# Patient Record
Sex: Female | Born: 1985
Health system: Southern US, Community
[De-identification: ages and names within clinical notes are randomized; demographics above are authoritative.]

## PROBLEM LIST (undated history)

## (undated) ENCOUNTER — Emergency Department (HOSPITAL_COMMUNITY): Admission: EM | Discharge status: Absent without leave | Payer: BC Managed Care – PPO

## (undated) DIAGNOSIS — Z8619 Personal history of other infectious and parasitic diseases: Secondary | ICD-10-CM

## (undated) DIAGNOSIS — Z87442 Personal history of urinary calculi: Secondary | ICD-10-CM

## (undated) DIAGNOSIS — Z87448 Personal history of other diseases of urinary system: Secondary | ICD-10-CM

## (undated) DIAGNOSIS — J45909 Unspecified asthma, uncomplicated: Secondary | ICD-10-CM

## (undated) DIAGNOSIS — D649 Anemia, unspecified: Secondary | ICD-10-CM

## (undated) DIAGNOSIS — N189 Chronic kidney disease, unspecified: Secondary | ICD-10-CM

## (undated) HISTORY — DX: Personal history of other diseases of urinary system: Z87.448

## (undated) HISTORY — PX: NO PAST SURGERIES: SHX2092

## (undated) HISTORY — DX: Personal history of other infectious and parasitic diseases: Z86.19

## (undated) HISTORY — DX: Chronic kidney disease, unspecified: N18.9

## (undated) HISTORY — DX: Unspecified asthma, uncomplicated: J45.909

## (undated) HISTORY — DX: Personal history of urinary calculi: Z87.442

---
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
## 2007-05-10 ENCOUNTER — Emergency Department (HOSPITAL_COMMUNITY): Admission: EM | Admit: 2007-05-10 | Discharge: 2007-05-10 | Payer: Self-pay | Admitting: Emergency Medicine

## 2008-03-24 ENCOUNTER — Ambulatory Visit (HOSPITAL_COMMUNITY): Admission: RE | Admit: 2008-03-24 | Discharge: 2008-03-24 | Payer: Self-pay | Admitting: Family Medicine

## 2009-01-24 ENCOUNTER — Ambulatory Visit (HOSPITAL_COMMUNITY): Admission: RE | Admit: 2009-01-24 | Discharge: 2009-01-24 | Payer: Self-pay | Admitting: Urology

## 2010-07-12 ENCOUNTER — Other Ambulatory Visit: Admission: RE | Admit: 2010-07-12 | Discharge: 2010-07-12 | Payer: Self-pay | Admitting: Obstetrics and Gynecology

## 2011-02-07 ENCOUNTER — Other Ambulatory Visit (HOSPITAL_COMMUNITY): Payer: Self-pay | Admitting: Obstetrics and Gynecology

## 2011-02-07 DIAGNOSIS — N971 Female infertility of tubal origin: Secondary | ICD-10-CM

## 2011-02-14 ENCOUNTER — Ambulatory Visit (HOSPITAL_COMMUNITY)
Admission: RE | Admit: 2011-02-14 | Discharge: 2011-02-14 | Disposition: A | Discharge status: Home / Self Care | Payer: 59 | Source: Ambulatory Visit | Attending: Obstetrics and Gynecology | Admitting: Obstetrics and Gynecology

## 2011-02-14 DIAGNOSIS — N979 Female infertility, unspecified: Secondary | ICD-10-CM | POA: Insufficient documentation

## 2011-02-14 DIAGNOSIS — N971 Female infertility of tubal origin: Secondary | ICD-10-CM

## 2011-07-28 ENCOUNTER — Encounter: Payer: Self-pay | Admitting: *Deleted

## 2011-07-28 ENCOUNTER — Emergency Department (HOSPITAL_COMMUNITY)
Admission: EM | Admit: 2011-07-28 | Discharge: 2011-07-28 | Disposition: A | Discharge status: Home / Self Care | Payer: 59 | Attending: Emergency Medicine | Admitting: Emergency Medicine

## 2011-07-28 ENCOUNTER — Emergency Department (HOSPITAL_COMMUNITY): Payer: 59

## 2011-07-28 DIAGNOSIS — F17200 Nicotine dependence, unspecified, uncomplicated: Secondary | ICD-10-CM | POA: Insufficient documentation

## 2011-07-28 DIAGNOSIS — N201 Calculus of ureter: Secondary | ICD-10-CM

## 2011-07-28 LAB — URINALYSIS, ROUTINE W REFLEX MICROSCOPIC
Glucose, UA: NEGATIVE mg/dL
Ketones, ur: >80 mg/dL — AB
Leukocytes, UA: NEGATIVE
Nitrite: NEGATIVE
Specific Gravity, Urine: >1.03 — ABNORMAL HIGH (ref 1.005–1.030)
pH: 6 (ref 5.0–8.0)

## 2011-07-28 LAB — DIFFERENTIAL
Basophils Absolute: 0.1 10*3/uL (ref 0.0–0.1)
Basophils Relative: 0 % (ref 0–1)
Lymphocytes Relative: 18 % (ref 12–46)
Monocytes Relative: 6 % (ref 3–12)
Neutro Abs: 14.3 10*3/uL — ABNORMAL HIGH (ref 1.7–7.7)
Neutrophils Relative %: 74 % (ref 43–77)

## 2011-07-28 LAB — CBC
Hemoglobin: 13.4 g/dL (ref 12.0–15.0)
MCHC: 33.8 g/dL (ref 30.0–36.0)
RDW: 13 % (ref 11.5–15.5)
WBC: 19.4 10*3/uL — ABNORMAL HIGH (ref 4.0–10.5)

## 2011-07-28 LAB — POCT PREGNANCY, URINE: Preg Test, Ur: NEGATIVE

## 2011-07-28 LAB — URINE MICROSCOPIC-ADD ON

## 2011-07-28 LAB — BASIC METABOLIC PANEL
CO2: 21 mEq/L (ref 19–32)
Chloride: 99 mEq/L (ref 96–112)
GFR calc Af Amer: >60 mL/min (ref >60)
Potassium: 3.1 mEq/L — ABNORMAL LOW (ref 3.5–5.1)

## 2011-07-28 MED ORDER — IBUPROFEN 800 MG PO TABS: 800.0000 mg | ORAL_TABLET | Freq: Three times a day (TID) | ORAL | Status: AC

## 2011-07-28 MED ORDER — HYDROMORPHONE HCL 1 MG/ML IJ SOLN
INTRAMUSCULAR | Status: AC
  Filled 2011-07-28: qty 1

## 2011-07-28 MED ORDER — HYDROMORPHONE HCL 1 MG/ML IJ SOLN
1.0000 mg | Freq: Once | INTRAMUSCULAR | Status: AC
  Administered 2011-07-28: 1 mg via INTRAVENOUS
  Filled 2011-07-28: qty 1

## 2011-07-28 MED ORDER — HYDROMORPHONE HCL 1 MG/ML IJ SOLN
1.0000 mg | Freq: Once | INTRAMUSCULAR | Status: AC
  Administered 2011-07-28: 1 mg via INTRAVENOUS

## 2011-07-28 MED ORDER — ONDANSETRON HCL 4 MG/2ML IJ SOLN
4.0000 mg | Freq: Once | INTRAMUSCULAR | Status: AC
  Administered 2011-07-28: 4 mg via INTRAVENOUS
  Filled 2011-07-28: qty 2

## 2011-07-28 MED ORDER — OXYCODONE-ACETAMINOPHEN 5-325 MG PO TABS: 1.0000 | ORAL_TABLET | Freq: Four times a day (QID) | ORAL | Status: AC | PRN

## 2011-07-28 NOTE — ED Notes (Signed)
After completing vitals pt stated she had become sleepy since receiving medication.  Turned off lights and made sure call light was within reach.

## 2011-07-28 NOTE — ED Notes (Signed)
Patient given medication for pain. See MAR. Patient appears in less distress at this time. Will continue to monitor.

## 2011-07-28 NOTE — ED Notes (Signed)
Patient resting with eyes closed at this time. Rates pain at 4/10 on NPS. NAD noted at this time.

## 2011-07-28 NOTE — ED Provider Notes (Signed)
Scribed for [employer]. Bernette Mayers, MD, the patient was seen in room APA02/APA02. This chart was scribed by [employer]. The patient's care started at 13:47  CSN: [id number] Arrival date & time: 07/28/2011  1:20 PM  Chief Complaint  Patient presents with  . Abdominal Pain  . Flank Pain   HPI Alyssa Mason is a 25 y.o. female who presents to the Emergency Department complaining of sharp, constant RLQ abdominal pain radiating from the back with sudden onset 2-3 hours ago. She reports that pain sometimes radiates into her groin and is not alleviated by laying still. She denies difficulty urinating, fever, diarrhea, constipation or surgeries on her abdomen. She reports nausea. Patient's LNMP was 2 weeks ago. She is on Clomiphene. There are no other associated symptoms and no other alleviating or aggravating factors.    HPI ELEMENTS:  Location: RLQ abdomen  Onset: 11am 07/28/2011 Duration: 2-3 hours ago  Timing: constant Quality: sharp  Context:  as above  Associated symptoms: as above    History reviewed. No pertinent past medical history. MEDICATIONS:  Previous Medications   CLOMIPHENE (CLOMID) 50 MG TABLET    Take 50 mg by mouth daily.       ALLERGIES:  Allergies as of 07/28/2011  . (No Known Allergies)      History reviewed. No pertinent past surgical history.  No family history on file.  History  Substance Use Topics  . Smoking status: Current Everyday Smoker -- 0.5 packs/day    Types: Cigarettes  . Smokeless tobacco: Not on file  . Alcohol Use: No    OB History    Grav Para Term Preterm Abortions TAB SAB Ect Mult Living                  Review of Systems  Constitutional: Negative for fever.  Gastrointestinal: Positive for nausea. Negative for diarrhea and constipation.  Genitourinary: Positive for flank pain. Negative for dysuria, urgency and difficulty urinating.  All other systems reviewed and are negative.    Physical Exam  BP 105/87  Pulse 84   Temp(Src) 97.9 F (36.6 C) (Oral)  Resp 22  Ht 5\' 1"  (1.549 m)  Wt 145 lb (65.772 kg)  BMI 27.40 kg/m2  SpO2 100%  LMP 07/14/2011  Physical Exam  Nursing note and vitals reviewed. Constitutional: She is oriented to person, place, and time. She appears well-developed and well-nourished. She appears distressed.  HENT:  Head: Normocephalic and atraumatic.  Mouth/Throat: No oropharyngeal exudate.  Eyes: Conjunctivae are normal. Pupils are equal, round, and reactive to light.  Neck: Normal range of motion. Neck supple. No tracheal deviation present.  Cardiovascular: Normal rate, regular rhythm, normal heart sounds and intact distal pulses.   No murmur heard. Pulmonary/Chest: Effort normal and breath sounds normal. No respiratory distress. She has no wheezes. She has no rales.  Abdominal: Soft. Bowel sounds are normal. She exhibits no distension. There is tenderness (Right lower quadrant). There is no rebound, no guarding and no CVA tenderness.  Musculoskeletal: Normal range of motion. She exhibits no edema and no tenderness.  Neurological: She is alert and oriented to person, place, and time. No cranial nerve deficit.  Skin: Skin is warm and dry. No rash noted. She is not diaphoretic. No erythema.  Psychiatric: She has a normal mood and affect.    ED Course  Procedures  OTHER DATA REVIEWED: Nursing notes, vital signs, and past medical records reviewed.    DIAGNOSTIC STUDIES: Oxygen Saturation is 100% on room air,  normal by my interpretation.      LABS / RADIOLOGY:  Results for orders placed during the hospital encounter of 07/28/11  URINALYSIS, ROUTINE W REFLEX MICROSCOPIC      Component Value Range   Color, Urine YELLOW  YELLOW    Appearance HAZY (*) CLEAR    Specific Gravity, Urine >1.030 (*) 1.005 - 1.030    pH 6.0  5.0 - 8.0    Glucose, UA NEGATIVE  NEGATIVE (mg/dL)   Hgb urine dipstick SMALL (*) NEGATIVE    Bilirubin Urine NEGATIVE  NEGATIVE    Ketones, ur >80 (*)  NEGATIVE (mg/dL)   Protein, ur NEGATIVE  NEGATIVE (mg/dL)   Urobilinogen, UA 0.2  0.0 - 1.0 (mg/dL)   Nitrite NEGATIVE  NEGATIVE    Leukocytes, UA NEGATIVE  NEGATIVE   CBC      Component Value Range   WBC 19.4 (*) 4.0 - 10.5 (K/uL)   RBC 4.59  3.87 - 5.11 (MIL/uL)   Hemoglobin 13.4  12.0 - 15.0 (g/dL)   HCT 47.8  29.5 - 62.1 (%)   MCV 86.3  78.0 - 100.0 (fL)   MCH 29.2  26.0 - 34.0 (pg)   MCHC 33.8  30.0 - 36.0 (g/dL)   RDW 30.8  65.7 - 84.6 (%)   Platelets 338  150 - 400 (K/uL)  DIFFERENTIAL      Component Value Range   Neutrophils Relative 74  43 - 77 (%)   Neutro Abs 14.3 (*) 1.7 - 7.7 (K/uL)   Lymphocytes Relative 18  12 - 46 (%)   Lymphs Abs 3.6  0.7 - 4.0 (K/uL)   Monocytes Relative 6  3 - 12 (%)   Monocytes Absolute 1.2 (*) 0.1 - 1.0 (K/uL)   Eosinophils Relative 2  0 - 5 (%)   Eosinophils Absolute 0.3  0.0 - 0.7 (K/uL)   Basophils Relative 0  0 - 1 (%)   Basophils Absolute 0.1  0.0 - 0.1 (K/uL)  BASIC METABOLIC PANEL      Component Value Range   Sodium 136  135 - 145 (mEq/L)   Potassium 3.1 (*) 3.5 - 5.1 (mEq/L)   Chloride 99  96 - 112 (mEq/L)   CO2 21  19 - 32 (mEq/L)   Glucose, Bld 120 (*) 70 - 99 (mg/dL)   BUN 9  6 - 23 (mg/dL)   Creatinine, Ser 9.62  0.50 - 1.10 (mg/dL)   Calcium 9.7  8.4 - 95.2 (mg/dL)   GFR calc non Af Amer >60  >60 (mL/min)   GFR calc Af Amer >60  >60 (mL/min)  POCT PREGNANCY, URINE      Component Value Range   Preg Test, Ur NEGATIVE    URINE MICROSCOPIC-ADD ON      Component Value Range   Squamous Epithelial / LPF FEW (*) RARE    WBC, UA 3-6  <3 (WBC/hpf)   RBC / HPF 3-6  <3 (RBC/hpf)   Bacteria, UA FEW (*) RARE      ED COURSE / COORDINATION OF CARE: 13:50 - EDMD examined patient and ordered a CT Abdomen Pelvis Wo Contrast, UA, CBC, Differential, BMP and Pregnancy, urine POC. 14:28 - Patient resting with eyes closed. Rates pain at 4/10 on NPS  MDM: CT shows a UVJ stone by my interpretation. Awaiting official radiology read.  Anticipate discharge if s/he agrees. Pt and family aware of plan. Pain controlled. Resting comfortably.  I personally performed the services described in the documentation, which were  scribed in my presence. The recorded information has been reviewed and considered.   SHELDON,CHARLES B.    Charles B. Bernette Mayers, MD 07/28/11 [phone]

## 2011-07-28 NOTE — ED Notes (Signed)
Patient with no complaints at this time. Respirations even and unlabored. Skin warm/dry. Discharge instructions reviewed with patient at this time. Patient given opportunity to voice concerns/ask questions. IV removed per policy and band-aid applied to site. Patient discharged at this time and left Emergency Department with steady gait.  

## 2011-07-28 NOTE — ED Notes (Signed)
Patient lying in bed with eyes closed. NAD noted. Appears to be sleeping. Family denies any needs at this time. Will continue to monitor.  Patient with 0/10 pain per FLACC scale.

## 2011-07-28 NOTE — ED Notes (Signed)
Patient ambulatory to restroom with steady gait.  Patient reporting pain. Dr. Bernette Mayers aware.  Dr. Bernette Mayers currently at bedside.

## 2011-07-28 NOTE — ED Notes (Signed)
Reports sudden onset of right flank pain approx 1-2 hours ago; reports pain in flank has subsided, but now has RLQ pain; describes as sharp; pt is pale, restless and c/o n/v with pain

## 2011-09-04 LAB — OB RESULTS CONSOLE ANTIBODY SCREEN: Antibody Screen: NEGATIVE

## 2011-09-04 LAB — OB RESULTS CONSOLE GC/CHLAMYDIA
Chlamydia: NEGATIVE
Gonorrhea: NEGATIVE

## 2011-09-04 LAB — OB RESULTS CONSOLE HEPATITIS B SURFACE ANTIGEN: Hepatitis B Surface Ag: NEGATIVE

## 2011-09-20 LAB — URINALYSIS, ROUTINE W REFLEX MICROSCOPIC
Bilirubin Urine: NEGATIVE
Glucose, UA: NEGATIVE
Ketones, ur: NEGATIVE
Protein, ur: NEGATIVE

## 2011-09-20 LAB — URINE MICROSCOPIC-ADD ON

## 2011-12-04 NOTE — L&D Delivery Note (Signed)
Delivery Note At 5:59 PM a viable and healthy female was delivered via Kiwi vacuum extraction.  Vertex was +3 station, ROA.  Fetal decels were noted with pushing.   APGAR: 8, 9; weight 7 lb 12.7 oz (3535 g).   Placenta status: Intact, Spontaneous.  Cord: 3 vessels.  Anesthesia: Epidural  Episiotomy: Right Mediolateral Lacerations: None Suture Repair: 3.0 chromic Est. Blood Loss (mL): 300  Mom to postpartum.  Baby to nursery-stable.  Mickel Baas 05/22/2012, 7:43 PM

## 2012-02-19 ENCOUNTER — Encounter (HOSPITAL_COMMUNITY): Payer: Self-pay

## 2012-02-19 ENCOUNTER — Emergency Department (HOSPITAL_COMMUNITY)
Admission: EM | Admit: 2012-02-19 | Discharge: 2012-02-20 | Disposition: A | Discharge status: Home / Self Care | Payer: 59 | Attending: Emergency Medicine | Admitting: Emergency Medicine

## 2012-02-19 DIAGNOSIS — J039 Acute tonsillitis, unspecified: Secondary | ICD-10-CM | POA: Insufficient documentation

## 2012-02-19 LAB — RAPID STREP SCREEN (MED CTR MEBANE ONLY): Streptococcus, Group A Screen (Direct): NEGATIVE

## 2012-02-19 NOTE — ED Provider Notes (Signed)
History     CSN: [id number]  Arrival date & time 02/19/12  2009   First MD Initiated Contact with Patient 02/19/12 2346      Chief Complaint  Patient presents with  . Sore Throat    (Consider location/radiation/quality/duration/timing/severity/associated sxs/prior treatment) HPI  26 year old female who is 7 months pregnant is presents with chief complaints of sore throat. Patient states for the past 5-6 days she has been throat irritation. Patient noticed white discharge from the tonsils. Patient continues an abnormal taste in her mouth. She has subjective fever, and chills. Patient denies sneezing, coughing, ear pain, runny nose, chest pain, shortness of breath, change in voice. She has followup with the primary care doctor initially and was prescribed prednisone. However she has not taken the prednisone due to being pregnant.  Pt is here requesting abx.  She denies abd pain, or rash.    History reviewed. No pertinent past medical history.  History reviewed. No pertinent past surgical history.  History reviewed. No pertinent family history.  History  Substance Use Topics  . Smoking status: Current Everyday Smoker -- 0.5 packs/day    Types: Cigarettes  . Smokeless tobacco: Not on file  . Alcohol Use: No    OB History    Grav Para Term Preterm Abortions TAB SAB Ect Mult Living                  Review of Systems  All other systems reviewed and are negative.    Allergies  Review of patient's allergies indicates no known allergies.  Home Medications   Current Outpatient Rx  Name Route Sig Dispense Refill  . ALBUTEROL 90 MCG/ACT IN AERS Inhalation Inhale 1 puff into the lungs daily.      Marland Kitchen CLOMIPHENE CITRATE 50 MG PO TABS Oral Take 50 mg by mouth daily.     Marland Kitchen CLOMIPHENE CITRATE 50 MG PO TABS Oral Take 200 mg by mouth daily. Patient starts on the third day for her cycle, and takes if for only 5 days.       BP 138/68  Pulse 103  Temp(Src) 98.1 F (36.7 C) (Oral)   Resp 18  SpO2 100%  Physical Exam  Nursing note and vitals reviewed. Constitutional: She appears well-developed and well-nourished. No distress.  HENT:  Head: Normocephalic and atraumatic.  Right Ear: External ear normal.  Nose: Nose normal.  Mouth/Throat: Uvula is midline and mucous membranes are normal. Oropharyngeal exudate present. No tonsillar abscesses.       Bilateral tonsillar exudate. No evidence of peritonsillar abscess, no evidence of Ludwig's angina.    Eyes: Conjunctivae are normal.  Neck: Normal range of motion. Neck supple.  Cardiovascular: Normal rate and regular rhythm.   Pulmonary/Chest: Effort normal and breath sounds normal. No respiratory distress. She exhibits no tenderness.  Abdominal: Soft. There is no tenderness.       Gravid  Lymphadenopathy:    She has cervical adenopathy.  Neurological: She is alert.    ED Course  Procedures (including critical care time)   Labs Reviewed  RAPID STREP SCREEN   No results found.   No diagnosis found.    MDM  Pt with tonsilar exudates and subjective fever.  No changes in voice.  No evidence of PTA.  Pt is concern due to duration of her sxs and sxs worsening.  She qualifies for [facility] criteria eventhough strep neg.  Will prescribe abx and lortab.  However i recommend pt to wait and treat and to f/u  with her PCP for further evaluation.  Since pt has glucose test tomorrow, i recommend not taking prednisone.  Pt voice understanding.          Fayrene Helper, PA-C 02/20/12 0032  Fayrene Helper, PA-C 02/20/12 1610

## 2012-02-19 NOTE — ED Notes (Signed)
Pt complains of a sore throat since last Thursday and she feels like her gums are swelling

## 2012-02-20 ENCOUNTER — Encounter (HOSPITAL_COMMUNITY): Payer: Self-pay | Admitting: *Deleted

## 2012-02-20 MED ORDER — LIDOCAINE VISCOUS 2 % MT SOLN: 20.0000 mL | OROMUCOSAL | Status: AC | PRN

## 2012-02-20 MED ORDER — AMOXICILLIN 500 MG PO CAPS: 500.0000 mg | ORAL_CAPSULE | Freq: Three times a day (TID) | ORAL | Status: AC

## 2012-02-20 NOTE — ED Notes (Signed)
Called to see pt with complaint of sore throat and also stated on arrival to room that she didn't feel her baby move. Arrived to pt on monitor with FHR 140's. G1P0, EDC 6/16 27 3/7weeks with good fetal movement and audible kicking. Pt states she is feeling the baby move a lot now that she is laying down. Discussed how to do kick counts. Pt reassured that baby is moving well. Tracing reviewed with EDP, monitoring d/c's. Pt will continue with routine care with [facility], next appoint today 3/20.

## 2012-02-20 NOTE — Discharge Instructions (Signed)
Tonsillitis Tonsils are lumps of lymphoid tissues at the back of the throat. Each tonsil has 20 crevices (crypts). Tonsils help fight nose and throat infections and keep infection from spreading to other parts of the body for the first 18 months of life. Tonsillitis is an infection of the throat that causes the tonsils to become red, tender, and swollen. CAUSES Sudden and, if treated, temporary (acute) tonsillitis is usually caused by infection with streptococcal bacteria. Long lasting (chronic) tonsillitis occurs when the crypts of the tonsils become filled with pieces of food and bacteria, which makes it easy for the tonsils to become constantly infected. SYMPTOMS  Symptoms of tonsillitis include:  A sore throat.   White patches on the tonsils.   Fever.   Tiredness.  DIAGNOSIS Tonsillitis can be diagnosed through a physical exam. Diagnosis can be confirmed with the results of lab tests, including a throat culture. TREATMENT  The goals of tonsillitis treatment include the reduction of the severity and duration of symptoms, prevention of associated conditions, and prevention of disease transmission. Tonsillitis caused by bacteria can be treated with antibiotics. Usually, treatment with antibiotics is started before the cause of the tonsillitis is known. However, if it is determined that the cause is not bacterial, antibiotics will not treat the tonsillitis. If attacks of tonsillitis are severe and frequent, your caregiver may recommend surgery to remove the tonsils (tonsillectomy). HOME CARE INSTRUCTIONS   Rest as much as possible and get plenty of sleep.   Drink plenty of fluids. While the throat is very sore, eat soft foods or liquids, such as sherbet, soups, or instant breakfast drinks.   Eat frozen ice pops.   Older children and adults may gargle with a warm or cold liquid to help soothe the throat. Mix 1 teaspoon of salt in 1 cup of water.   Other family members who also develop a  sore throat or fever should have a medical exam or throat culture.   Only take over-the-counter or prescription medicines for pain, discomfort, or fever as directed by your caregiver.   If you are given antibiotics, take them as directed. Finish them even if you start to feel better.  SEEK MEDICAL CARE IF:   Your baby is older than 3 months with a rectal temperature of 100.5 F (38.1 C) or higher for more than 1 day.   Large, tender lumps develop in your neck.   A rash develops.   Green, yellow-brown, or bloody substance is coughed up.   You are unable to swallow liquids or food for 24 hours.   Your child is unable to swallow food or liquids for 12 hours.  SEEK IMMEDIATE MEDICAL CARE IF:   You develop any new symptoms such as vomiting, severe headache, stiff neck, chest pain, or trouble breathing or swallowing.   You have severe throat pain along with drooling or voice changes.   You have severe pain, unrelieved with recommended medications.   You are unable to fully open the mouth.   You develop redness, swelling, or severe pain anywhere in the neck.   You have a fever.   Your baby is older than 3 months with a rectal temperature of 102 F (38.9 C) or higher.   Your baby is 12 months old or younger with a rectal temperature of 100.4 F (38 C) or higher.  MAKE SURE YOU:   Understand these instructions.   Will watch your condition.   Will get help right away if you are not  watch your condition.   Will get help right away if you are not doing well or get worse.  Document Released: 08/29/2005 Document Revised: 11/08/2011 Document Reviewed: 01/25/2011  Exit[employer] Patient Information 2012 [employer].

## 2012-02-20 NOTE — ED Notes (Signed)
Rapid response OB RN notified, will come over to monitor, PA Laveda Norman aware and states once pt is cleared from them she can be discharged home

## 2012-02-20 NOTE — ED Provider Notes (Signed)
Medical screening examination/treatment/procedure(s) were performed by non-physician practitioner and as supervising physician I was immediately available for consultation/collaboration.   Lyanne Co, MD 02/20/12 [phone]

## 2012-04-22 LAB — OB RESULTS CONSOLE GBS: GBS: NEGATIVE

## 2012-05-20 ENCOUNTER — Encounter (HOSPITAL_COMMUNITY): Payer: Self-pay | Admitting: *Deleted

## 2012-05-20 ENCOUNTER — Telehealth (HOSPITAL_COMMUNITY): Payer: Self-pay | Admitting: *Deleted

## 2012-05-20 NOTE — Telephone Encounter (Signed)
Preadmission screen  

## 2012-05-22 ENCOUNTER — Inpatient Hospital Stay (HOSPITAL_COMMUNITY): Payer: 59 | Admitting: Anesthesiology

## 2012-05-22 ENCOUNTER — Encounter (HOSPITAL_COMMUNITY): Payer: Self-pay | Admitting: Anesthesiology

## 2012-05-22 ENCOUNTER — Encounter (HOSPITAL_COMMUNITY): Payer: Self-pay | Admitting: *Deleted

## 2012-05-22 ENCOUNTER — Inpatient Hospital Stay (HOSPITAL_COMMUNITY)
Admission: AD | Admit: 2012-05-22 | Discharge: 2012-05-24 | DRG: 775 | Disposition: A | Discharge status: Home / Self Care | Payer: 59 | Source: Ambulatory Visit | Attending: Obstetrics and Gynecology | Admitting: Obstetrics and Gynecology

## 2012-05-22 LAB — CBC
MCH: 28.8 pg (ref 26.0–34.0)
MCHC: 33 g/dL (ref 30.0–36.0)
MCV: 87.2 fL (ref 78.0–100.0)
Platelets: 261 10*3/uL (ref 150–400)
RDW: 14.6 % (ref 11.5–15.5)
WBC: 12 10*3/uL — ABNORMAL HIGH (ref 4.0–10.5)

## 2012-05-22 MED ORDER — LACTATED RINGERS IV SOLN
INTRAVENOUS | Status: DC
  Administered 2012-05-22: 12:00:00 via INTRAUTERINE

## 2012-05-22 MED ORDER — ZOLPIDEM TARTRATE 5 MG PO TABS: 5.0000 mg | ORAL_TABLET | Freq: Every evening | ORAL | Status: DC | PRN

## 2012-05-22 MED ORDER — SENNOSIDES-DOCUSATE SODIUM 8.6-50 MG PO TABS
2.0000 | ORAL_TABLET | Freq: Every day | ORAL | Status: DC
  Administered 2012-05-22 – 2012-05-23 (×2): 2 via ORAL

## 2012-05-22 MED ORDER — OXYTOCIN BOLUS FROM INFUSION
250.0000 mL | Freq: Once | INTRAVENOUS | Status: DC
  Filled 2012-05-22: qty 500

## 2012-05-22 MED ORDER — ONDANSETRON HCL 4 MG/2ML IJ SOLN: 4.0000 mg | INTRAMUSCULAR | Status: DC | PRN

## 2012-05-22 MED ORDER — LIDOCAINE HCL (PF) 1 % IJ SOLN
INTRAMUSCULAR | Status: DC | PRN
  Administered 2012-05-22: 4 mL
  Administered 2012-05-22: 30 mL
  Administered 2012-05-22 (×2): 4 mL

## 2012-05-22 MED ORDER — ALBUTEROL SULFATE HFA 108 (90 BASE) MCG/ACT IN AERS: 1.0000 | INHALATION_SPRAY | Freq: Four times a day (QID) | RESPIRATORY_TRACT | Status: DC | PRN

## 2012-05-22 MED ORDER — CITRIC ACID-SODIUM CITRATE 334-500 MG/5ML PO SOLN: 30.0000 mL | ORAL | Status: DC | PRN

## 2012-05-22 MED ORDER — IBUPROFEN 600 MG PO TABS
600.0000 mg | ORAL_TABLET | Freq: Four times a day (QID) | ORAL | Status: DC
  Administered 2012-05-22 – 2012-05-24 (×6): 600 mg via ORAL
  Filled 2012-05-22 (×6): qty 1

## 2012-05-22 MED ORDER — DIPHENHYDRAMINE HCL 50 MG/ML IJ SOLN: 12.5000 mg | INTRAMUSCULAR | Status: DC | PRN

## 2012-05-22 MED ORDER — PHENYLEPHRINE 40 MCG/ML (10ML) SYRINGE FOR IV PUSH (FOR BLOOD PRESSURE SUPPORT)
80.0000 ug | PREFILLED_SYRINGE | INTRAVENOUS | Status: DC | PRN
  Filled 2012-05-22: qty 2
  Filled 2012-05-22: qty 5

## 2012-05-22 MED ORDER — ACETAMINOPHEN 325 MG PO TABS: 650.0000 mg | ORAL_TABLET | ORAL | Status: DC | PRN

## 2012-05-22 MED ORDER — LACTATED RINGERS IV SOLN: 500.0000 mL | INTRAVENOUS | Status: DC | PRN

## 2012-05-22 MED ORDER — ONDANSETRON HCL 4 MG PO TABS: 4.0000 mg | ORAL_TABLET | ORAL | Status: DC | PRN

## 2012-05-22 MED ORDER — LANOLIN HYDROUS EX OINT: TOPICAL_OINTMENT | CUTANEOUS | Status: DC | PRN

## 2012-05-22 MED ORDER — LACTATED RINGERS IV SOLN
500.0000 mL | Freq: Once | INTRAVENOUS | Status: AC
  Administered 2012-05-22: 500 mL via INTRAVENOUS

## 2012-05-22 MED ORDER — FLEET ENEMA 7-19 GM/118ML RE ENEM: 1.0000 | ENEMA | RECTAL | Status: DC | PRN

## 2012-05-22 MED ORDER — PRENATAL MULTIVITAMIN CH
1.0000 | ORAL_TABLET | Freq: Every day | ORAL | Status: DC
  Administered 2012-05-23 – 2012-05-24 (×2): 1 via ORAL
  Filled 2012-05-22 (×2): qty 1

## 2012-05-22 MED ORDER — DIPHENHYDRAMINE HCL 25 MG PO CAPS: 25.0000 mg | ORAL_CAPSULE | Freq: Four times a day (QID) | ORAL | Status: DC | PRN

## 2012-05-22 MED ORDER — BENZOCAINE-MENTHOL 20-0.5 % EX AERO
1.0000 "application " | INHALATION_SPRAY | CUTANEOUS | Status: DC | PRN
  Administered 2012-05-22: 1 via TOPICAL
  Filled 2012-05-22: qty 56

## 2012-05-22 MED ORDER — IBUPROFEN 600 MG PO TABS: 600.0000 mg | ORAL_TABLET | Freq: Four times a day (QID) | ORAL | Status: DC | PRN

## 2012-05-22 MED ORDER — OXYCODONE-ACETAMINOPHEN 5-325 MG PO TABS: 1.0000 | ORAL_TABLET | ORAL | Status: DC | PRN

## 2012-05-22 MED ORDER — PHENYLEPHRINE 40 MCG/ML (10ML) SYRINGE FOR IV PUSH (FOR BLOOD PRESSURE SUPPORT)
80.0000 ug | PREFILLED_SYRINGE | INTRAVENOUS | Status: DC | PRN
  Filled 2012-05-22: qty 2

## 2012-05-22 MED ORDER — BUTORPHANOL TARTRATE 2 MG/ML IJ SOLN: 1.0000 mg | INTRAMUSCULAR | Status: DC | PRN

## 2012-05-22 MED ORDER — ONDANSETRON HCL 4 MG/2ML IJ SOLN: 4.0000 mg | Freq: Four times a day (QID) | INTRAMUSCULAR | Status: DC | PRN

## 2012-05-22 MED ORDER — OXYTOCIN 40 UNITS IN LACTATED RINGERS INFUSION - SIMPLE MED
62.5000 mL/h | Freq: Once | INTRAVENOUS | Status: AC
  Administered 2012-05-22: 2.5 [IU]/h via INTRAVENOUS
  Filled 2012-05-22: qty 1000

## 2012-05-22 MED ORDER — WITCH HAZEL-GLYCERIN EX PADS: 1.0000 "application " | MEDICATED_PAD | CUTANEOUS | Status: DC | PRN

## 2012-05-22 MED ORDER — LACTATED RINGERS IV SOLN
INTRAVENOUS | Status: DC
  Administered 2012-05-22 (×2): via INTRAVENOUS

## 2012-05-22 MED ORDER — LIDOCAINE HCL (PF) 1 % IJ SOLN
30.0000 mL | INTRAMUSCULAR | Status: DC | PRN
  Filled 2012-05-22: qty 30

## 2012-05-22 MED ORDER — OXYCODONE-ACETAMINOPHEN 5-325 MG PO TABS
1.0000 | ORAL_TABLET | ORAL | Status: DC | PRN
  Filled 2012-05-22: qty 1

## 2012-05-22 MED ORDER — DIBUCAINE 1 % RE OINT: 1.0000 "application " | TOPICAL_OINTMENT | RECTAL | Status: DC | PRN

## 2012-05-22 MED ORDER — TETANUS-DIPHTH-ACELL PERTUSSIS 5-2.5-18.5 LF-MCG/0.5 IM SUSP
0.5000 mL | Freq: Once | INTRAMUSCULAR | Status: AC
  Administered 2012-05-23: 0.5 mL via INTRAMUSCULAR
  Filled 2012-05-22: qty 0.5

## 2012-05-22 MED ORDER — SIMETHICONE 80 MG PO CHEW: 80.0000 mg | CHEWABLE_TABLET | ORAL | Status: DC | PRN

## 2012-05-22 MED ORDER — EPHEDRINE 5 MG/ML INJ
10.0000 mg | INTRAVENOUS | Status: DC | PRN
  Filled 2012-05-22: qty 2

## 2012-05-22 MED ORDER — FENTANYL 2.5 MCG/ML BUPIVACAINE 1/10 % EPIDURAL INFUSION (WH - ANES)
14.0000 mL/h | INTRAMUSCULAR | Status: DC
  Administered 2012-05-22 (×3): 14 mL/h via EPIDURAL
  Filled 2012-05-22 (×3): qty 60

## 2012-05-22 MED ORDER — EPHEDRINE 5 MG/ML INJ
10.0000 mg | INTRAVENOUS | Status: DC | PRN
  Filled 2012-05-22: qty 2
  Filled 2012-05-22: qty 4

## 2012-05-22 NOTE — Progress Notes (Signed)
Will wait to push until dr. Arlyce Dice is at bedside, positioned pt for comfort, pt verbalized understanding.

## 2012-05-22 NOTE — Anesthesia Preprocedure Evaluation (Addendum)
Anesthesia Evaluation  Patient identified by MRN, date of birth, ID band Patient awake    Reviewed: Allergy & Precautions, H&P , NPO status , Patient's Chart, lab work & pertinent test results, reviewed documented beta blocker date and time   History of Anesthesia Complications Negative for: history of anesthetic complications  Airway Mallampati: I TM Distance: >3 FB Neck ROM: full    Dental  (+) Teeth Intact   Pulmonary asthma (can't remember last inhaler use) , former smoker breath sounds clear to auscultation        Cardiovascular negative cardio ROS  Rhythm:regular Rate:Normal     Neuro/Psych negative neurological ROS  negative psych ROS   GI/Hepatic negative GI ROS, Neg liver ROS,   Endo/Other  negative endocrine ROS  Renal/GU negative Renal ROS     Musculoskeletal   Abdominal   Peds  Hematology negative hematology ROS (+)   Anesthesia Other Findings   Reproductive/Obstetrics (+) Pregnancy                          Anesthesia Physical Anesthesia Plan  ASA: II  Anesthesia Plan: Epidural   Post-op Pain Management:    Induction:   Airway Management Planned:   Additional Equipment:   Intra-op Plan:   Post-operative Plan:   Informed Consent: I have reviewed the patients History and Physical, chart, labs and discussed the procedure including the risks, benefits and alternatives for the proposed anesthesia with the patient or authorized representative who has indicated his/her understanding and acceptance.     Plan Discussed with:   Anesthesia Plan Comments:         Anesthesia Quick Evaluation

## 2012-05-22 NOTE — Progress Notes (Signed)
Pt now 4-5cm.  Contracting regularly. AROM- moderate meconium. FHTs- reactive PLAN/ Will start Amnioinfusion and check MDUs.

## 2012-05-22 NOTE — MAU Note (Signed)
Patient states she woke up cramping really bad. No leaking of fluid or vaginal bleeding. Good fetal movement

## 2012-05-22 NOTE — H&P (Signed)
Pt is a 26 year old white female, G1P0 at term who was admitted in the latent phase of labor with pain. Pt was 3cm on admission. PNC was complicated by a history of infertility.  This pregnancy was conceived on Clomid. Pt also has a history of Asthma. She failed the 1 hour OGTT but passed the 3 hour OGTT. GBS-  PMHx: see hollister  PE: VSSAF        Heent: wnl       Abd: gravid, no masses FHTs reactive  IMP/ IUP at term in latent phase of labor         Asthma         H.O. Infertility Plan/ Admit

## 2012-05-22 NOTE — Progress Notes (Signed)
Vtx +2, ROA.  Good variability, decels to 90 with pushes and good recovery.  Does not meet ACOG criteria for immediate delivery.  Will continue second stage.

## 2012-05-22 NOTE — Progress Notes (Signed)
Dr. Arlyce Dice reviewed strip, will continue pushing

## 2012-05-22 NOTE — Anesthesia Procedure Notes (Signed)
Epidural Patient location during procedure: OB Start time: 05/22/2012 7:46 AM Reason for block: procedure for pain  Staffing Performed by: anesthesiologist   Preanesthetic Checklist Completed: patient identified, site marked, surgical consent, pre-op evaluation, timeout performed, IV checked, risks and benefits discussed and monitors and equipment checked  Epidural Patient position: sitting Prep: site prepped and draped and DuraPrep Patient monitoring: continuous pulse ox and blood pressure Approach: midline Injection technique: LOR air  Needle:  Needle type: Tuohy  Needle gauge: 17 G Needle length: 9 cm Needle insertion depth: 6 cm Catheter type: closed end flexible Catheter size: 19 Gauge Catheter at skin depth: 11 cm Test dose: negative  Assessment Events: blood not aspirated, injection not painful, no injection resistance, negative IV test and no paresthesia  Additional Notes Discussed risk of headache, infection, bleeding, nerve injury and failed or incomplete block.  Patient voices understanding and wishes to proceed.

## 2012-05-23 LAB — CBC
HCT: 31.5 % — ABNORMAL LOW (ref 36.0–46.0)
Hemoglobin: 10.2 g/dL — ABNORMAL LOW (ref 12.0–15.0)
MCH: 28.4 pg (ref 26.0–34.0)
MCHC: 32.4 g/dL (ref 30.0–36.0)
MCV: 87.7 fL (ref 78.0–100.0)
RDW: 14.6 % (ref 11.5–15.5)

## 2012-05-23 NOTE — Anesthesia Postprocedure Evaluation (Signed)
  Anesthesia Post-op Note  Patient: Alyssa Mason  Procedure(s) Performed: * No procedures listed *  Patient Location: Mother/Baby  Anesthesia Type: Epidural  Level of Consciousness: awake  Airway and Oxygen Therapy: Patient Spontanous Breathing  Post-op Pain: none  Post-op Assessment: Patient's Cardiovascular Status Stable, Respiratory Function Stable, Patent Airway, No signs of Nausea or vomiting, Adequate PO intake, Pain level controlled, No headache, No backache, No residual numbness and No residual motor weakness  Post-op Vital Signs: Reviewed and stable  Complications: No apparent anesthesia complications

## 2012-05-23 NOTE — Progress Notes (Signed)
Post Partum Day 1 Subjective: no complaints, up ad lib, voiding, tolerating PO, + flatus and appropriate perineal discomfort  Objective: Blood pressure 108/70, pulse 72, temperature 97.5 F (36.4 C), temperature source Oral, resp. rate 18, height 5\' 1"  (1.549 m), weight 83.462 kg (184 lb), last menstrual period 07/14/2011, SpO2 100.00%, unknown if currently breastfeeding.  Physical Exam:  General: alert, cooperative and appears stated age Lochia: appropriate Uterine Fundus: firm   Basename 05/23/12 0555 05/22/12 0423  HGB 10.2* 12.4  HCT 31.5* 37.6    Assessment/Plan: Breastfeeding and Lactation consult   LOS: 1 day   Jason Hauge H. 05/23/2012, 10:01 AM

## 2012-05-24 MED ORDER — HYDROCODONE-ACETAMINOPHEN 5-500 MG PO TABS: 1.0000 | ORAL_TABLET | ORAL | Status: AC | PRN

## 2012-05-24 MED ORDER — IBUPROFEN 600 MG PO TABS: 600.0000 mg | ORAL_TABLET | Freq: Four times a day (QID) | ORAL | Status: AC

## 2012-05-24 NOTE — Discharge Summary (Signed)
Obstetric Discharge Summary Reason for Admission: onset of labor Prenatal Procedures: ultrasound Intrapartum Procedures: vacuum Postpartum Procedures: none Complications-Operative and Postpartum: 2nd degree perineal laceration Hemoglobin  Date Value Range Status  05/23/2012 10.2* 12.0 - 15.0 g/dL Final     DELTA CHECK NOTED     REPEATED TO VERIFY     HCT  Date Value Range Status  05/23/2012 31.5* 36.0 - 46.0 % Final    Physical Exam:  General: alert, cooperative and appears stated age 26: appropriate Uterine Fundus: firm  Discharge Diagnoses: Term Pregnancy-delivered  Discharge Information: Date: 05/24/2012 Activity: pelvic rest Diet: routine Medications: Ibuprofen and Vicodin Condition: stable Instructions: refer to practice specific booklet Discharge to: home Follow-up Information    Follow up with Mickel Baas, MD. Schedule an appointment as soon as possible for a visit in 4 weeks. (For a postpartum evaluation)    Contact information:   [address] [place] [address] [phone]          Newborn Data: Live born female  Birth Weight: 7 lb 12.7 oz (3535 g) APGAR: 8, 9  Home with mother.  Rebbecca Osuna H. 05/24/2012, 11:14 AM

## 2012-05-25 ENCOUNTER — Inpatient Hospital Stay (HOSPITAL_COMMUNITY): Admission: RE | Admit: 2012-05-25 | Payer: 59 | Source: Ambulatory Visit

## 2012-08-07 IMAGING — CT CT ABD-PELV W/O CM
2 of 3 series · 9 of 46 positions shown, 11 images · non-contrast
Comparison: CT abdomen and pelvis 01/24/2009.

CLINICAL DATA: Right flank and right lower quadrant pain.

CT ABDOMEN AND PELVIS WITHOUT CONTRAST
TECHNIQUE: Multidetector CT imaging of the abdomen and pelvis was
performed following the standard protocol without intravenous
contrast.

[Series 4: mpr coronal (id) · coronal · 0.65mm/px · 8 of 67 slices shown, 9 images]
[im 8/67  soft-tissue]
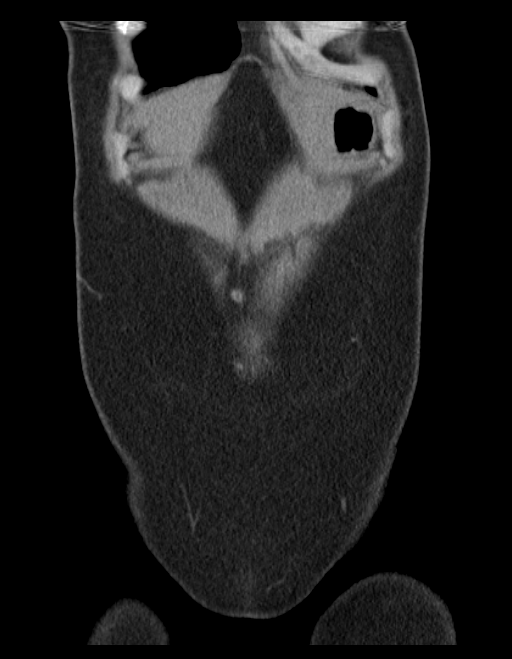
[im 8/67  bone]
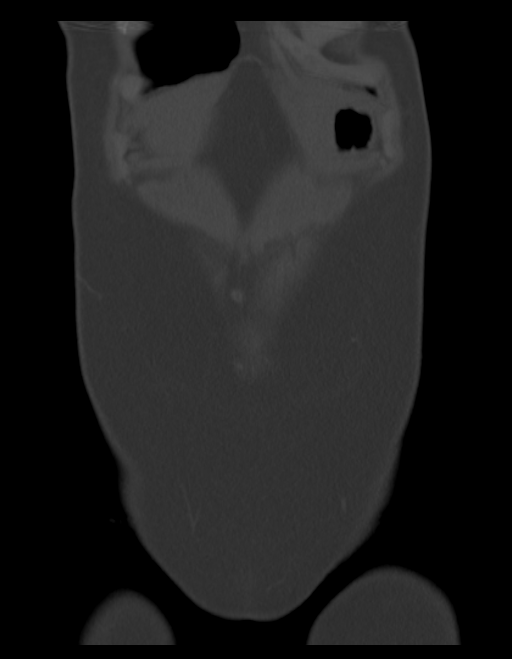
[im 15/67  soft-tissue]
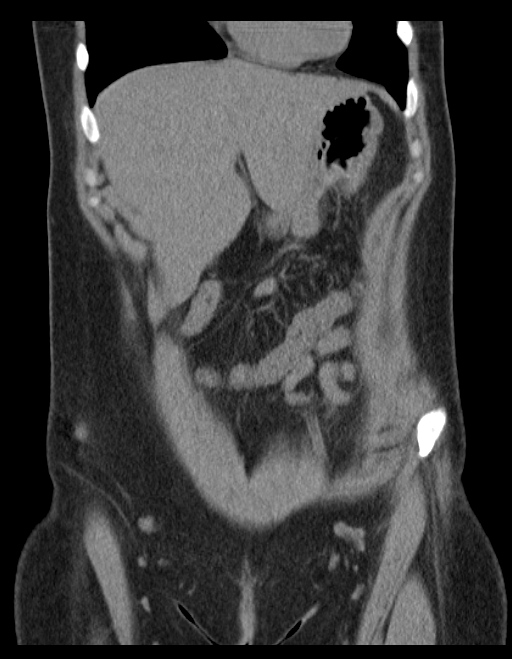
[im 23/67  soft-tissue]
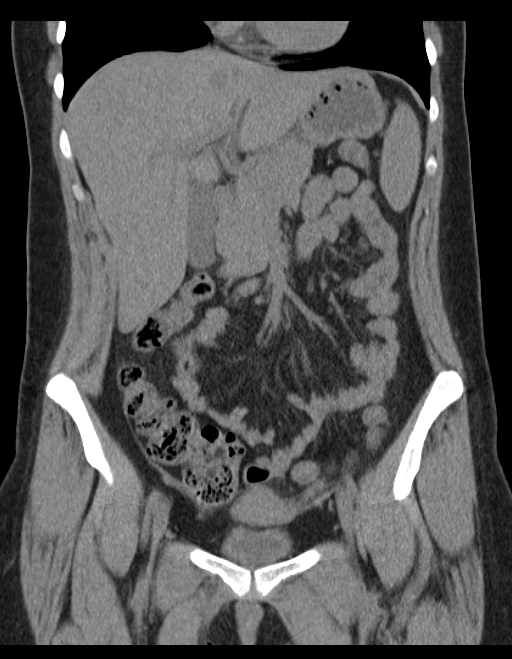
[im 30/67  soft-tissue]
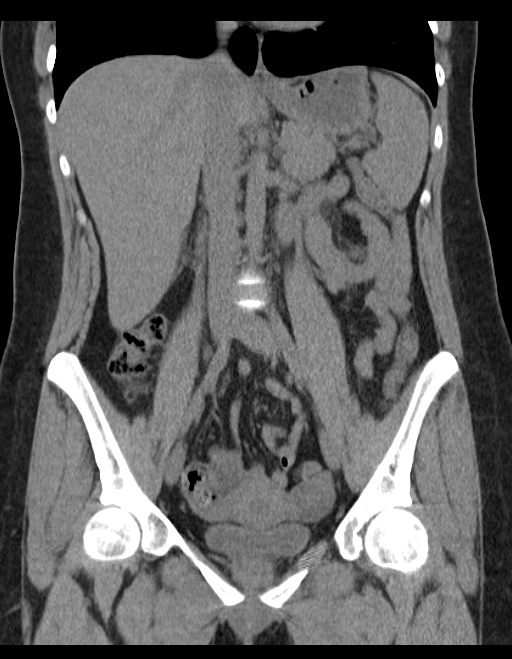
[im 37/67  soft-tissue]
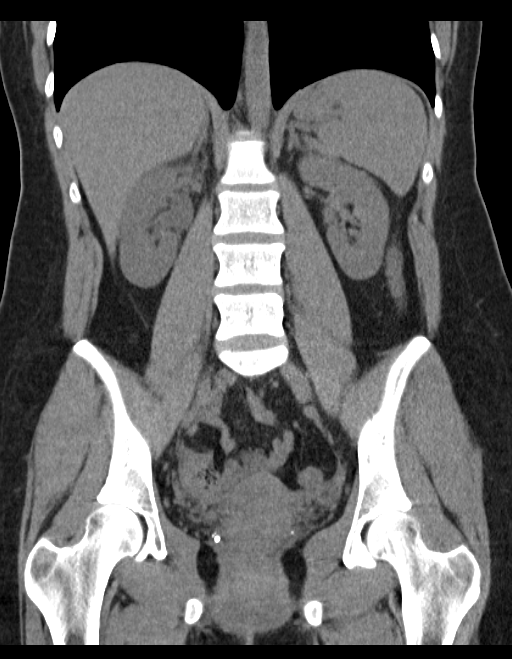
[im 45/67  soft-tissue]
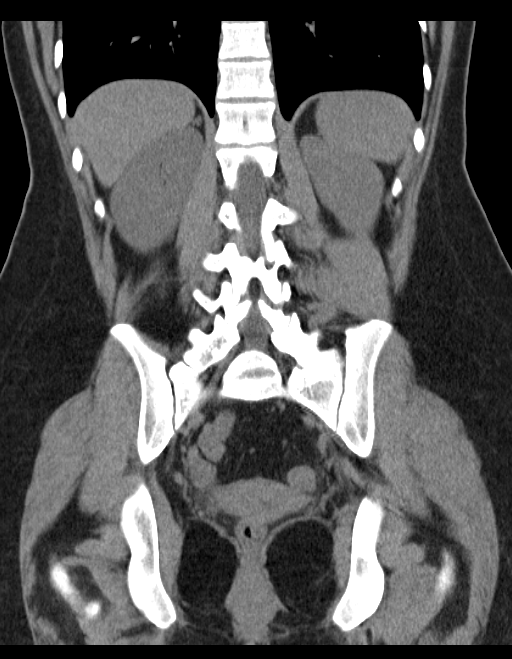
[im 52/67  soft-tissue]
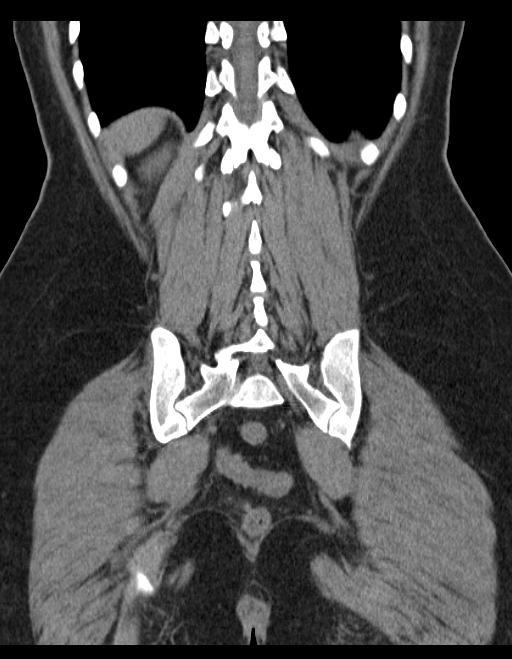
[im 59/67  soft-tissue]
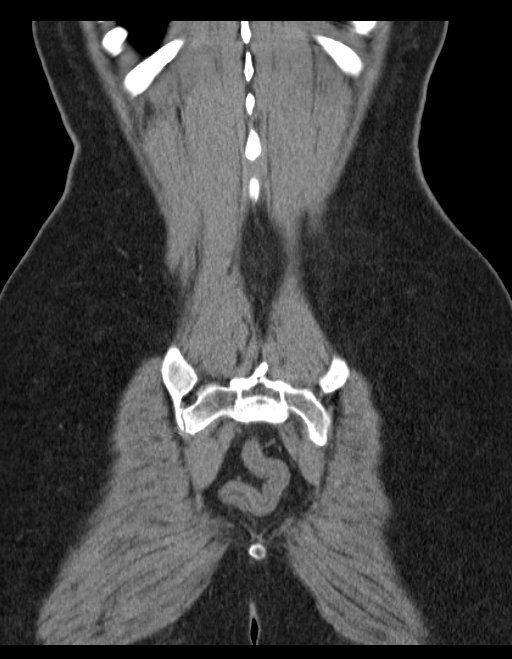

[Series 5: mpr sagittal (id) · sagittal · 0.48mm/px · 1 of 104 slices shown, 2 images]
[im 35/104  soft-tissue]
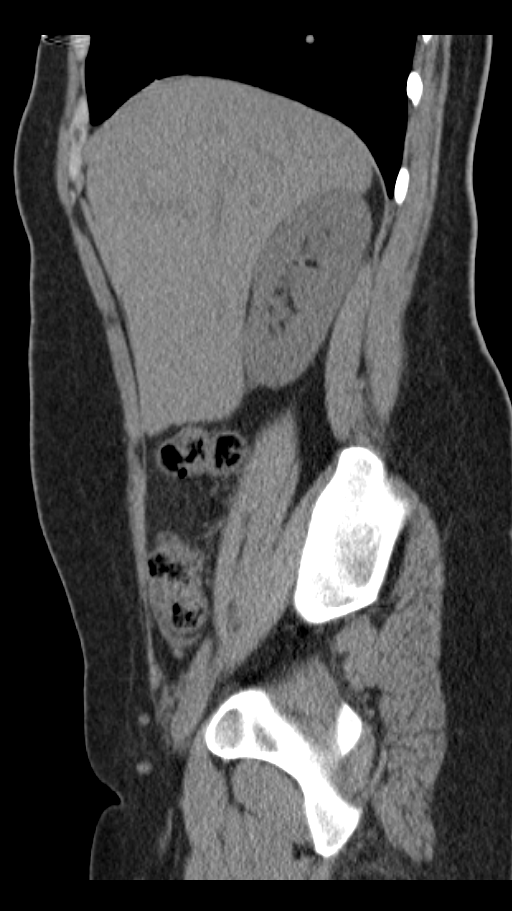
[im 35/104  bone]
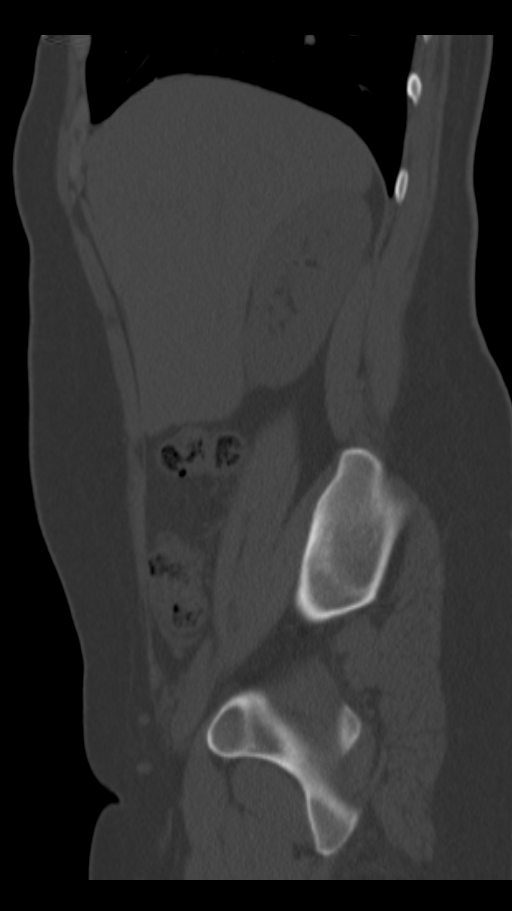

[9 of 46 positions shown; findings below may reference images not displayed]

FINDINGS: The lung bases are clear.  No pleural or pericardial
effusion.

The patient has moderate right hydronephrosis due to a tiny stone
measuring 1-2 mm in the distal right ureter just proximal to the
UVJ.  Also seen is a punctate nonobstructing stone in the mid pole
of the right kidney.  The left kidney is unremarkable.

The gallbladder, liver, adrenal glands, spleen and pancreas all
appear normal.  Uterus and adnexa are unremarkable.  The stomach,
small and large bowel and appendix appear normal.  There is no
lymphadenopathy or fluid.  No focal bony abnormality.
IMPRESSION: Moderate right hydronephrosis due to a 1 to 2 mm stone just
proximal to the UVJ.  Punctate nonobstructing stone in the mid pole
right kidney is also identified.

## 2013-07-27 ENCOUNTER — Other Ambulatory Visit: Payer: Self-pay | Admitting: Family Medicine

## 2014-03-17 ENCOUNTER — Emergency Department (HOSPITAL_COMMUNITY): Payer: BC Managed Care – PPO

## 2014-03-17 ENCOUNTER — Emergency Department (HOSPITAL_COMMUNITY)
Admission: EM | Admit: 2014-03-17 | Discharge: 2014-03-17 | Disposition: A | Discharge status: Home / Self Care | Payer: BC Managed Care – PPO | Attending: Emergency Medicine | Admitting: Emergency Medicine

## 2014-03-17 ENCOUNTER — Encounter (HOSPITAL_COMMUNITY): Payer: Self-pay | Admitting: Emergency Medicine

## 2014-03-17 DIAGNOSIS — Z87442 Personal history of urinary calculi: Secondary | ICD-10-CM | POA: Insufficient documentation

## 2014-03-17 DIAGNOSIS — R42 Dizziness and giddiness: Secondary | ICD-10-CM

## 2014-03-17 DIAGNOSIS — R0789 Other chest pain: Secondary | ICD-10-CM

## 2014-03-17 DIAGNOSIS — Z8619 Personal history of other infectious and parasitic diseases: Secondary | ICD-10-CM | POA: Insufficient documentation

## 2014-03-17 DIAGNOSIS — Z87448 Personal history of other diseases of urinary system: Secondary | ICD-10-CM | POA: Insufficient documentation

## 2014-03-17 DIAGNOSIS — J45909 Unspecified asthma, uncomplicated: Secondary | ICD-10-CM | POA: Insufficient documentation

## 2014-03-17 DIAGNOSIS — Z79899 Other long term (current) drug therapy: Secondary | ICD-10-CM | POA: Insufficient documentation

## 2014-03-17 DIAGNOSIS — Z3202 Encounter for pregnancy test, result negative: Secondary | ICD-10-CM | POA: Insufficient documentation

## 2014-03-17 DIAGNOSIS — Z87891 Personal history of nicotine dependence: Secondary | ICD-10-CM | POA: Insufficient documentation

## 2014-03-17 LAB — COMPREHENSIVE METABOLIC PANEL
ALBUMIN: 3.6 g/dL (ref 3.5–5.2)
ALT: 19 U/L (ref 0–35)
AST: 17 U/L (ref 0–37)
Alkaline Phosphatase: 59 U/L (ref 39–117)
BUN: 10 mg/dL (ref 6–23)
CALCIUM: 9.1 mg/dL (ref 8.4–10.5)
CO2: 27 mEq/L (ref 19–32)
CREATININE: 0.72 mg/dL (ref 0.50–1.10)
Chloride: 103 mEq/L (ref 96–112)
GFR calc Af Amer: >90 mL/min (ref >90)
GFR calc non Af Amer: >90 mL/min (ref >90)
Glucose, Bld: 101 mg/dL — ABNORMAL HIGH (ref 70–99)
Potassium: 3.7 mEq/L (ref 3.7–5.3)
Sodium: 140 mEq/L (ref 137–147)
TOTAL PROTEIN: 7.4 g/dL (ref 6.0–8.3)
Total Bilirubin: <0.2 mg/dL — ABNORMAL LOW (ref 0.3–1.2)

## 2014-03-17 LAB — CBC WITH DIFFERENTIAL/PLATELET
BASOS PCT: 1 % (ref 0–1)
Basophils Absolute: 0 10*3/uL (ref 0.0–0.1)
EOS ABS: 0.3 10*3/uL (ref 0.0–0.7)
EOS PCT: 4 % (ref 0–5)
HCT: 38.6 % (ref 36.0–46.0)
Hemoglobin: 12.6 g/dL (ref 12.0–15.0)
Lymphocytes Relative: 31 % (ref 12–46)
Lymphs Abs: 2.3 10*3/uL (ref 0.7–4.0)
MCH: 27.8 pg (ref 26.0–34.0)
MCHC: 32.6 g/dL (ref 30.0–36.0)
MCV: 85.2 fL (ref 78.0–100.0)
Monocytes Absolute: 0.7 10*3/uL (ref 0.1–1.0)
Monocytes Relative: 10 % (ref 3–12)
NEUTROS PCT: 55 % (ref 43–77)
Neutro Abs: 4.1 10*3/uL (ref 1.7–7.7)
PLATELETS: 265 10*3/uL (ref 150–400)
RBC: 4.53 MIL/uL (ref 3.87–5.11)
RDW: 14.1 % (ref 11.5–15.5)
WBC: 7.4 10*3/uL (ref 4.0–10.5)

## 2014-03-17 LAB — URINE MICROSCOPIC-ADD ON

## 2014-03-17 LAB — URINALYSIS, ROUTINE W REFLEX MICROSCOPIC
Bilirubin Urine: NEGATIVE
Glucose, UA: NEGATIVE mg/dL
Ketones, ur: NEGATIVE mg/dL
Leukocytes, UA: NEGATIVE
Nitrite: NEGATIVE
PH: 6.5 (ref 5.0–8.0)
PROTEIN: NEGATIVE mg/dL
SPECIFIC GRAVITY, URINE: 1.015 (ref 1.005–1.030)
Urobilinogen, UA: 0.2 mg/dL (ref 0.0–1.0)

## 2014-03-17 LAB — PREGNANCY, URINE: PREG TEST UR: NEGATIVE

## 2014-03-17 LAB — D-DIMER, QUANTITATIVE: D-Dimer, Quant: <0.27 ug/mL-FEU (ref 0.00–0.48)

## 2014-03-17 LAB — TROPONIN I: Troponin I: <0.3 ng/mL (ref <0.30)

## 2014-03-17 NOTE — ED Provider Notes (Signed)
CSN: [id number]     Arrival date & time 03/17/14  1714 History   First MD Initiated Contact with Patient 03/17/14 1740     Chief Complaint  Patient presents with  . Chest Pain     (Consider location/radiation/quality/duration/timing/severity/associated sxs/prior Treatment) HPI  Patient reports about 4 PM she started having sharp chest pains that she describes in the center of her chest. They last a few seconds. She states afterwards she feels like she is going to black out or pass out. She feels like her arms go numb. This lasts about a minute. And she feels dizzy with it. She had 2 episodes today. She's been having these episodes since June 2013 when her son was born. She denies being under any extra stress however her husband asked that an argument yesterday with another family member could have precipitated it. She denies cough, fever. She states nothing she does makes the pain worse, nothing she does makes it feel better. She has been evaluated by [facility] in [place] and had a echocardiogram. She wore a Holter monitor for 2 weeks without episodes occurring.  [facility]P Dr [facility] in [place]  Past Medical History  Diagnosis Date  . H/O varicella   . H/O pyelonephritis   . Asthma     prn albuterol  . History of kidney stones    History reviewed. No pertinent past surgical history. Family History  Problem Relation Age of Onset  . COPD Mother   . Heart disease Father     heart murmur  . Hypertension Father   . Thyroid disease Father   . Rheum arthritis Father   . Thyroid disease Maternal Grandmother   . Rheum arthritis Paternal Grandmother   . Heart disease Paternal Grandfather   . Cancer Paternal Grandfather     lung   History  Substance Use Topics  . Smoking status: Former Smoker -- 0.50 packs/day    Types: Cigarettes  . Smokeless tobacco: Not on file  . Alcohol Use: No   Employed Lives with spouse  OB History   Grav Para Term Preterm Abortions TAB SAB  Ect Mult Living   1 1 1  0 0 0 0 0 0 1     Review of Systems  All other systems reviewed and are negative.     Allergies  Banana and Watermelon concentrate  Home Medications   Prior to Admission medications   Medication Sig Start Date End Date Taking? Authorizing Provider  albuterol (PROVENTIL) (2.5 MG/3ML) 0.083% nebulizer solution inhale contents of 1 in nebulizer every 6 hours if needed 07/27/13  Yes Babs SciaraScott A Luking, MD  albuterol (PROVENTIL,VENTOLIN) 90 MCG/ACT inhaler Inhale 1 puff into the lungs every 6 (six) hours as needed. For shortness of breath/asthma   Yes Historical Provider, MD  FALMINA 0.1-20 MG-MCG tablet Take 1 tablet by mouth daily. 02/21/14  Yes Historical Provider, MD   BP 126/68  Pulse 84  Temp(Src) 97.8 F (36.6 C) (Oral)  Resp 14  Ht 5\' 1"  (1.549 m)  Wt 150 lb (68.04 kg)  BMI 28.36 kg/m2  SpO2 100%  LMP 03/16/2014  Vital signs normal   Physical Exam  Nursing note and vitals reviewed. Constitutional: She is oriented to person, place, and time. She appears well-developed and well-nourished.  Non-toxic appearance. She does not appear ill. No distress.  HENT:  Head: Normocephalic and atraumatic.  Right Ear: External ear normal.  Left Ear: External ear normal.  Nose: Nose normal. No mucosal edema or  rhinorrhea.  Mouth/Throat: Oropharynx is clear and moist and mucous membranes are normal. No dental abscesses or uvula swelling.  Eyes: Conjunctivae and EOM are normal. Pupils are equal, round, and reactive to light.  Neck: Normal range of motion and full passive range of motion without pain. Neck supple.  Cardiovascular: Normal rate, regular rhythm and normal heart sounds.  Exam reveals no gallop and no friction rub.   No murmur heard. Pulmonary/Chest: Effort normal and breath sounds normal. No respiratory distress. She has no wheezes. She has no rhonchi. She has no rales. She exhibits tenderness. She exhibits no crepitus.    Abdominal: Soft. Normal  appearance and bowel sounds are normal. She exhibits no distension. There is no tenderness. There is no rebound and no guarding.  Musculoskeletal: Normal range of motion. She exhibits no edema and no tenderness.  Moves all extremities well.   Neurological: She is alert and oriented to person, place, and time. She has normal strength. No cranial nerve deficit.  Skin: Skin is warm, dry and intact. No rash noted. No erythema. No pallor.  Psychiatric: She has a normal mood and affect. Her speech is normal and behavior is normal. Her mood appears not anxious.    ED Course  Procedures (including critical care time)  At time of discharge patient states she had one episode about 5 minutes before I came into the room which would have been around 8 pm. Had an alarm at 2001. On review of the strip it appears she had one PAC.   Labs Review Results for orders placed during the hospital encounter of 03/17/14  TROPONIN I      Result Value Ref Range   Troponin I <0.30  <0.30 ng/mL  CBC WITH DIFFERENTIAL      Result Value Ref Range   WBC 7.4  4.0 - 10.5 K/uL   RBC 4.53  3.87 - 5.11 MIL/uL   Hemoglobin 12.6  12.0 - 15.0 g/dL   HCT 16.138.6  09.636.0 - 04.546.0 %   MCV 85.2  78.0 - 100.0 fL   MCH 27.8  26.0 - 34.0 pg   MCHC 32.6  30.0 - 36.0 g/dL   RDW 40.914.1  81.111.5 - 91.415.5 %   Platelets 265  150 - 400 K/uL   Neutrophils Relative % 55  43 - 77 %   Neutro Abs 4.1  1.7 - 7.7 K/uL   Lymphocytes Relative 31  12 - 46 %   Lymphs Abs 2.3  0.7 - 4.0 K/uL   Monocytes Relative 10  3 - 12 %   Monocytes Absolute 0.7  0.1 - 1.0 K/uL   Eosinophils Relative 4  0 - 5 %   Eosinophils Absolute 0.3  0.0 - 0.7 K/uL   Basophils Relative 1  0 - 1 %   Basophils Absolute 0.0  0.0 - 0.1 K/uL  COMPREHENSIVE METABOLIC PANEL      Result Value Ref Range   Sodium 140  137 - 147 mEq/L   Potassium 3.7  3.7 - 5.3 mEq/L   Chloride 103  96 - 112 mEq/L   CO2 27  19 - 32 mEq/L   Glucose, Bld 101 (*) 70 - 99 mg/dL   BUN 10  6 - 23 mg/dL    Creatinine, Ser 7.820.72  0.50 - 1.10 mg/dL   Calcium 9.1  8.4 - 95.610.5 mg/dL   Total Protein 7.4  6.0 - 8.3 g/dL   Albumin 3.6  3.5 - 5.2 g/dL   AST  17  0 - 37 U/L   ALT 19  0 - 35 U/L   Alkaline Phosphatase 59  39 - 117 U/L   Total Bilirubin <0.2 (*) 0.3 - 1.2 mg/dL   GFR calc non Af Amer >90  >90 mL/min   GFR calc Af Amer >90  >90 mL/min  D-DIMER, QUANTITATIVE      Result Value Ref Range   D-Dimer, Quant <0.27  0.00 - 0.48 ug/mL-FEU  URINALYSIS, ROUTINE W REFLEX MICROSCOPIC      Result Value Ref Range   Color, Urine YELLOW  YELLOW   APPearance CLEAR  CLEAR   Specific Gravity, Urine 1.015  1.005 - 1.030   pH 6.5  5.0 - 8.0   Glucose, UA NEGATIVE  NEGATIVE mg/dL   Hgb urine dipstick TRACE (*) NEGATIVE   Bilirubin Urine NEGATIVE  NEGATIVE   Ketones, ur NEGATIVE  NEGATIVE mg/dL   Protein, ur NEGATIVE  NEGATIVE mg/dL   Urobilinogen, UA 0.2  0.0 - 1.0 mg/dL   Nitrite NEGATIVE  NEGATIVE   Leukocytes, UA NEGATIVE  NEGATIVE  PREGNANCY, URINE      Result Value Ref Range   Preg Test, Ur NEGATIVE  NEGATIVE  URINE MICROSCOPIC-ADD ON      Result Value Ref Range   Squamous Epithelial / LPF FEW (*) RARE   WBC, UA 0-2  <3 WBC/hpf   RBC / HPF 0-2  <3 RBC/hpf   Bacteria, UA RARE  RARE   Laboratory interpretation all normal  Imaging Review Dg Chest 2 View  03/17/2014   CLINICAL DATA:  Chest pain intermittently 2 years.  EXAM: CHEST  2 VIEW  COMPARISON:  None.  FINDINGS: The heart size and mediastinal contours are within normal limits. Both lungs are clear. The visualized skeletal structures are unremarkable.  IMPRESSION: No active cardiopulmonary disease.   Electronically Signed   By: Elberta Fortis M.D.   On: 03/17/2014 19:32     EKG Interpretation   Date/Time:  Wednesday March 17 2014 17:28:46 EDT Ventricular Rate:  82 PR Interval:  110 QRS Duration: 72 QT Interval:  366 QTC Calculation: 427 R Axis:   74 Text Interpretation:  Sinus rhythm with short PR Otherwise normal ECG No    previous ECGs available Confirmed by Derryck Shahan  MD-I, Consuelo Thayne ([id number]) on 03/17/2014  7:13:00 PM      MDM   Final diagnoses:  Atypical chest pain  Dizziness    Plan discharge  Devoria Albe, MD, Franz Dell, MD 03/17/14 2040

## 2014-03-17 NOTE — Discharge Instructions (Signed)
Your tests tonight were normal. You had one early beat on your monitor that you felt just before I came back into your room that is called a PAC (premature atrial contraction) and they are normal. Consider the cardiologist you saw before for more testing. Return if you get a constant chest pain lasting more than 20-30 minutes constantly. You can take ibuprofen 600 mg 4 times a day OR aleve 2 tabs OTC twice a day for chest wall pain.

## 2014-03-17 NOTE — ED Notes (Signed)
Pain assessment placed to allow for dc

## 2014-03-17 NOTE — ED Notes (Addendum)
Assessment completed at 2048 before pt discharge. Placed to allow dc

## 2014-03-17 NOTE — ED Notes (Signed)
Pt states she has had ongoing chest pain off and on accompanied by feelings of anxiety for past six months, pt states that the chest pain has been occuring 2x per week.

## 2014-03-29 ENCOUNTER — Other Ambulatory Visit (HOSPITAL_COMMUNITY): Payer: Self-pay | Admitting: Internal Medicine

## 2014-03-29 DIAGNOSIS — R1310 Dysphagia, unspecified: Secondary | ICD-10-CM

## 2014-04-01 ENCOUNTER — Other Ambulatory Visit (HOSPITAL_COMMUNITY): Payer: 59

## 2014-04-20 ENCOUNTER — Ambulatory Visit: Payer: Self-pay | Admitting: Family Medicine

## 2014-09-10 ENCOUNTER — Other Ambulatory Visit (HOSPITAL_COMMUNITY): Payer: Self-pay | Admitting: Internal Medicine

## 2014-09-10 DIAGNOSIS — R1310 Dysphagia, unspecified: Secondary | ICD-10-CM

## 2014-09-14 ENCOUNTER — Other Ambulatory Visit (HOSPITAL_COMMUNITY): Payer: 59

## 2014-10-01 ENCOUNTER — Ambulatory Visit (HOSPITAL_COMMUNITY)
Admission: RE | Admit: 2014-10-01 | Discharge: 2014-10-01 | Disposition: A | Discharge status: Home / Self Care | Payer: BC Managed Care – PPO | Source: Ambulatory Visit | Attending: Internal Medicine | Admitting: Internal Medicine

## 2014-10-01 DIAGNOSIS — K219 Gastro-esophageal reflux disease without esophagitis: Secondary | ICD-10-CM | POA: Insufficient documentation

## 2014-10-01 DIAGNOSIS — R1310 Dysphagia, unspecified: Secondary | ICD-10-CM | POA: Diagnosis not present

## 2014-10-04 ENCOUNTER — Encounter (HOSPITAL_COMMUNITY): Payer: Self-pay | Admitting: Emergency Medicine

## 2014-11-25 ENCOUNTER — Emergency Department (HOSPITAL_COMMUNITY)
Admission: EM | Admit: 2014-11-25 | Discharge: 2014-11-25 | Disposition: A | Discharge status: Home / Self Care | Payer: BC Managed Care – PPO | Attending: Emergency Medicine | Admitting: Emergency Medicine

## 2014-11-25 ENCOUNTER — Encounter (HOSPITAL_COMMUNITY): Payer: Self-pay | Admitting: *Deleted

## 2014-11-25 DIAGNOSIS — Z87891 Personal history of nicotine dependence: Secondary | ICD-10-CM | POA: Insufficient documentation

## 2014-11-25 DIAGNOSIS — J45909 Unspecified asthma, uncomplicated: Secondary | ICD-10-CM | POA: Diagnosis not present

## 2014-11-25 DIAGNOSIS — R002 Palpitations: Secondary | ICD-10-CM

## 2014-11-25 DIAGNOSIS — Z87448 Personal history of other diseases of urinary system: Secondary | ICD-10-CM | POA: Insufficient documentation

## 2014-11-25 DIAGNOSIS — Z79899 Other long term (current) drug therapy: Secondary | ICD-10-CM | POA: Insufficient documentation

## 2014-11-25 DIAGNOSIS — Z87442 Personal history of urinary calculi: Secondary | ICD-10-CM | POA: Diagnosis not present

## 2014-11-25 DIAGNOSIS — G8929 Other chronic pain: Secondary | ICD-10-CM | POA: Diagnosis not present

## 2014-11-25 DIAGNOSIS — Z8619 Personal history of other infectious and parasitic diseases: Secondary | ICD-10-CM | POA: Insufficient documentation

## 2014-11-25 DIAGNOSIS — F419 Anxiety disorder, unspecified: Secondary | ICD-10-CM | POA: Diagnosis not present

## 2014-11-25 DIAGNOSIS — R079 Chest pain, unspecified: Secondary | ICD-10-CM | POA: Insufficient documentation

## 2014-11-25 LAB — CBC
HCT: 39.9 % (ref 36.0–46.0)
Hemoglobin: 13 g/dL (ref 12.0–15.0)
MCH: 27.8 pg (ref 26.0–34.0)
MCHC: 32.6 g/dL (ref 30.0–36.0)
MCV: 85.3 fL (ref 78.0–100.0)
PLATELETS: 243 10*3/uL (ref 150–400)
RBC: 4.68 MIL/uL (ref 3.87–5.11)
RDW: 14.7 % (ref 11.5–15.5)
WBC: 8.2 10*3/uL (ref 4.0–10.5)

## 2014-11-25 LAB — TSH: TSH: 2.33 u[IU]/mL (ref 0.350–4.500)

## 2014-11-25 MED ORDER — LORAZEPAM 1 MG PO TABS: 1.0000 mg | ORAL_TABLET | Freq: Three times a day (TID) | ORAL | Status: DC | PRN

## 2014-11-25 NOTE — ED Provider Notes (Signed)
CSN: [id number]     Arrival date & time 11/25/14  0018 History   First MD Initiated Contact with Patient 11/25/14 0043     Chief Complaint  Patient presents with  . Chest Pain     (Consider location/radiation/quality/duration/timing/severity/associated sxs/prior Treatment) HPI Comments: Patient with chronic daily chest pain tonight had pain all day with numbness to her arms that since has resolved.  Has had cardiac evaluation who told her it was stress related and given Rx for Xanax that she does not take because she has a small child   Patient is a 28 y.o. female presenting with chest pain. The history is provided by the patient.  Chest Pain Pain location:  Substernal area Pain quality: aching   Pain radiates to:  Upper back Pain radiates to the back: yes   Pain severity:  Mild Onset quality:  Gradual Timing:  Intermittent Progression:  Worsening Chronicity:  Chronic Context: at rest and stress   Context: not breathing, no drug use, not eating, no intercourse, not lifting, no movement, not raising an arm and no trauma   Relieved by:  None tried Worsened by:  Nothing tried Ineffective treatments:  None tried Associated symptoms: anxiety and palpitations   Associated symptoms: no abdominal pain, no altered mental status, no back pain, no claudication, no cough, no dizziness, no fever, no heartburn, no lower extremity edema, no nausea, no near-syncope, no shortness of breath, not vomiting and no weakness   Risk factors: birth control   Risk factors: no smoking     Past Medical History  Diagnosis Date  . H/O varicella   . H/O pyelonephritis   . Asthma     prn albuterol  . History of kidney stones    History reviewed. No pertinent past surgical history. Family History  Problem Relation Age of Onset  . COPD Mother   . Heart disease Father     heart murmur  . Hypertension Father   . Thyroid disease Father   . Rheum arthritis Father   . Thyroid disease Maternal  Grandmother   . Rheum arthritis Paternal Grandmother   . Heart disease Paternal Grandfather   . Cancer Paternal Grandfather     lung   History  Substance Use Topics  . Smoking status: Former Smoker -- 0.50 packs/day    Types: Cigarettes  . Smokeless tobacco: Not on file  . Alcohol Use: No   OB History    Gravida Para Term Preterm AB TAB SAB Ectopic Multiple Living   1 1 1  0 0 0 0 0 0 1     Review of Systems  Constitutional: Negative for fever.  Respiratory: Negative for cough, shortness of breath and wheezing.   Cardiovascular: Positive for chest pain and palpitations. Negative for claudication and near-syncope.  Gastrointestinal: Negative for heartburn, nausea, vomiting and abdominal pain.  Musculoskeletal: Negative for back pain.  Neurological: Negative for dizziness and weakness.  Psychiatric/Behavioral: The patient is nervous/anxious.   All other systems reviewed and are negative.     Allergies  Banana and Watermelon concentrate  Home Medications   Prior to Admission medications   Medication Sig Start Date End Date Taking? Authorizing Provider  acetaminophen (TYLENOL) 500 MG tablet Take 1,000 mg by mouth every 6 (six) hours as needed for moderate pain.   Yes Historical Provider, MD  albuterol (PROVENTIL,VENTOLIN) 90 MCG/ACT inhaler Inhale 1 puff into the lungs every 6 (six) hours as needed. For shortness of breath/asthma   Yes Historical Provider, MD  FALMINA 0.1-20 MG-MCG tablet Take 1 tablet by mouth daily. 02/21/14  Yes Historical Provider, MD  albuterol (PROVENTIL) (2.5 MG/3ML) 0.083% nebulizer solution inhale contents of 1 in nebulizer every 6 hours if needed 07/27/13   Babs SciaraScott A Luking, MD  LORazepam (ATIVAN) 1 MG tablet Take 1 tablet (1 mg total) by mouth every 8 (eight) hours as needed for anxiety. 11/25/14   Arman FilterGail K Quintez Maselli, NP   BP 105/73 mmHg  Pulse 73  Temp(Src) 97.4 F (36.3 C)  Resp 19  SpO2 99%  LMP 11/18/2014 Physical Exam  Constitutional: She is  oriented to person, place, and time. She appears well-developed and well-nourished.  HENT:  Head: Normocephalic.  Eyes: Pupils are equal, round, and reactive to light.  Neck: Normal range of motion.  Cardiovascular: Normal rate and regular rhythm.   Pulmonary/Chest: Effort normal and breath sounds normal.  Abdominal: Soft. Bowel sounds are normal.  Musculoskeletal: Normal range of motion. She exhibits no edema.  Neurological: She is alert and oriented to person, place, and time.  Skin: Skin is warm. No rash noted.  Nursing note and vitals reviewed.   ED Course  Procedures (including critical care time) Labs Review Labs Reviewed  TSH  CBC    Imaging Review No results found.   EKG Interpretation None      MDM  No pain now   Will check CBC and TSH She has no indication of anemia and her TSH is within normal parameters.  She'll be discharged him with that prescription for Ativan to try for her generalized anxiety disorder and has been instructed to follow-up with her primary care physician Final diagnoses:  Chronic chest pain  Palpitations         Arman FilterGail K Ammarie Matsuura, NP 11/25/14 [id number]  Olivia Mackielga M Otter, MD 11/25/14 0500

## 2014-11-25 NOTE — ED Notes (Signed)
The pt has been seen by a cardiologist and no cardiac findings were diagnosed

## 2014-11-25 NOTE — ED Notes (Signed)
The pt has chronic chest pain .  Tonight she has had the same symptoms as in the past.  Mid-chest pain with dizziness and both arms numb.  She has some difficulty breathing with these episodes.  No sob now.   No pain at present.  lmp  One week ago

## 2014-11-25 NOTE — Discharge Instructions (Signed)
Tonight your thyroid level was checked, which is normal. You have been given a prescription for Ativan, which is a short acting medication for your anxiety that you can try to see if it helps your chest discomfort.  When you have this, please make an appointment with your primary care physician for further evaluation and treatment

## 2014-11-25 NOTE — ED Notes (Addendum)
Removed IV from patient's right AC. Dressing clean, dry and intact. Anticipated discharge.

## 2014-11-25 NOTE — ED Notes (Signed)
Np at bedside

## 2015-03-28 IMAGING — CR DG CHEST 2V
2 series · 2 of 2 positions shown · non-contrast
Comparison: None.

CLINICAL DATA: Chest pain intermittently 2 years.

EXAM:
CHEST  2 VIEW

[view not recorded (1 of 2)]
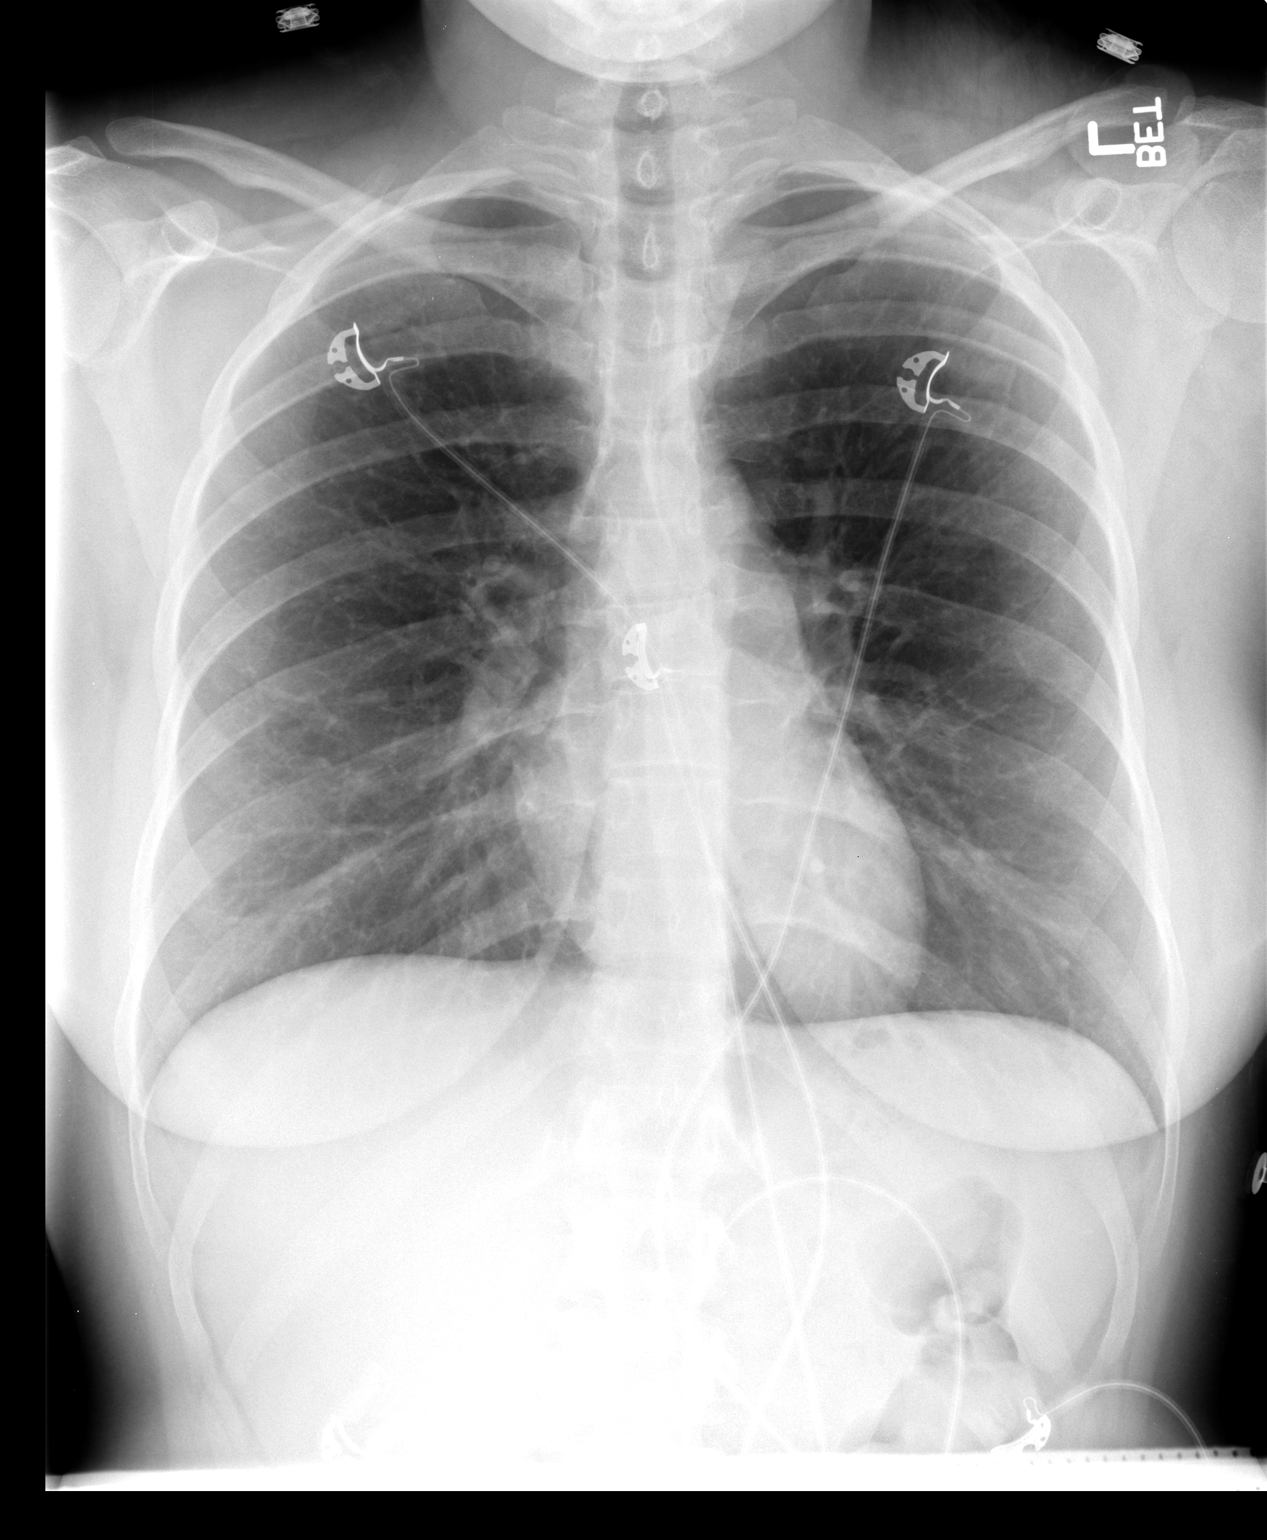

[view not recorded (2 of 2)]
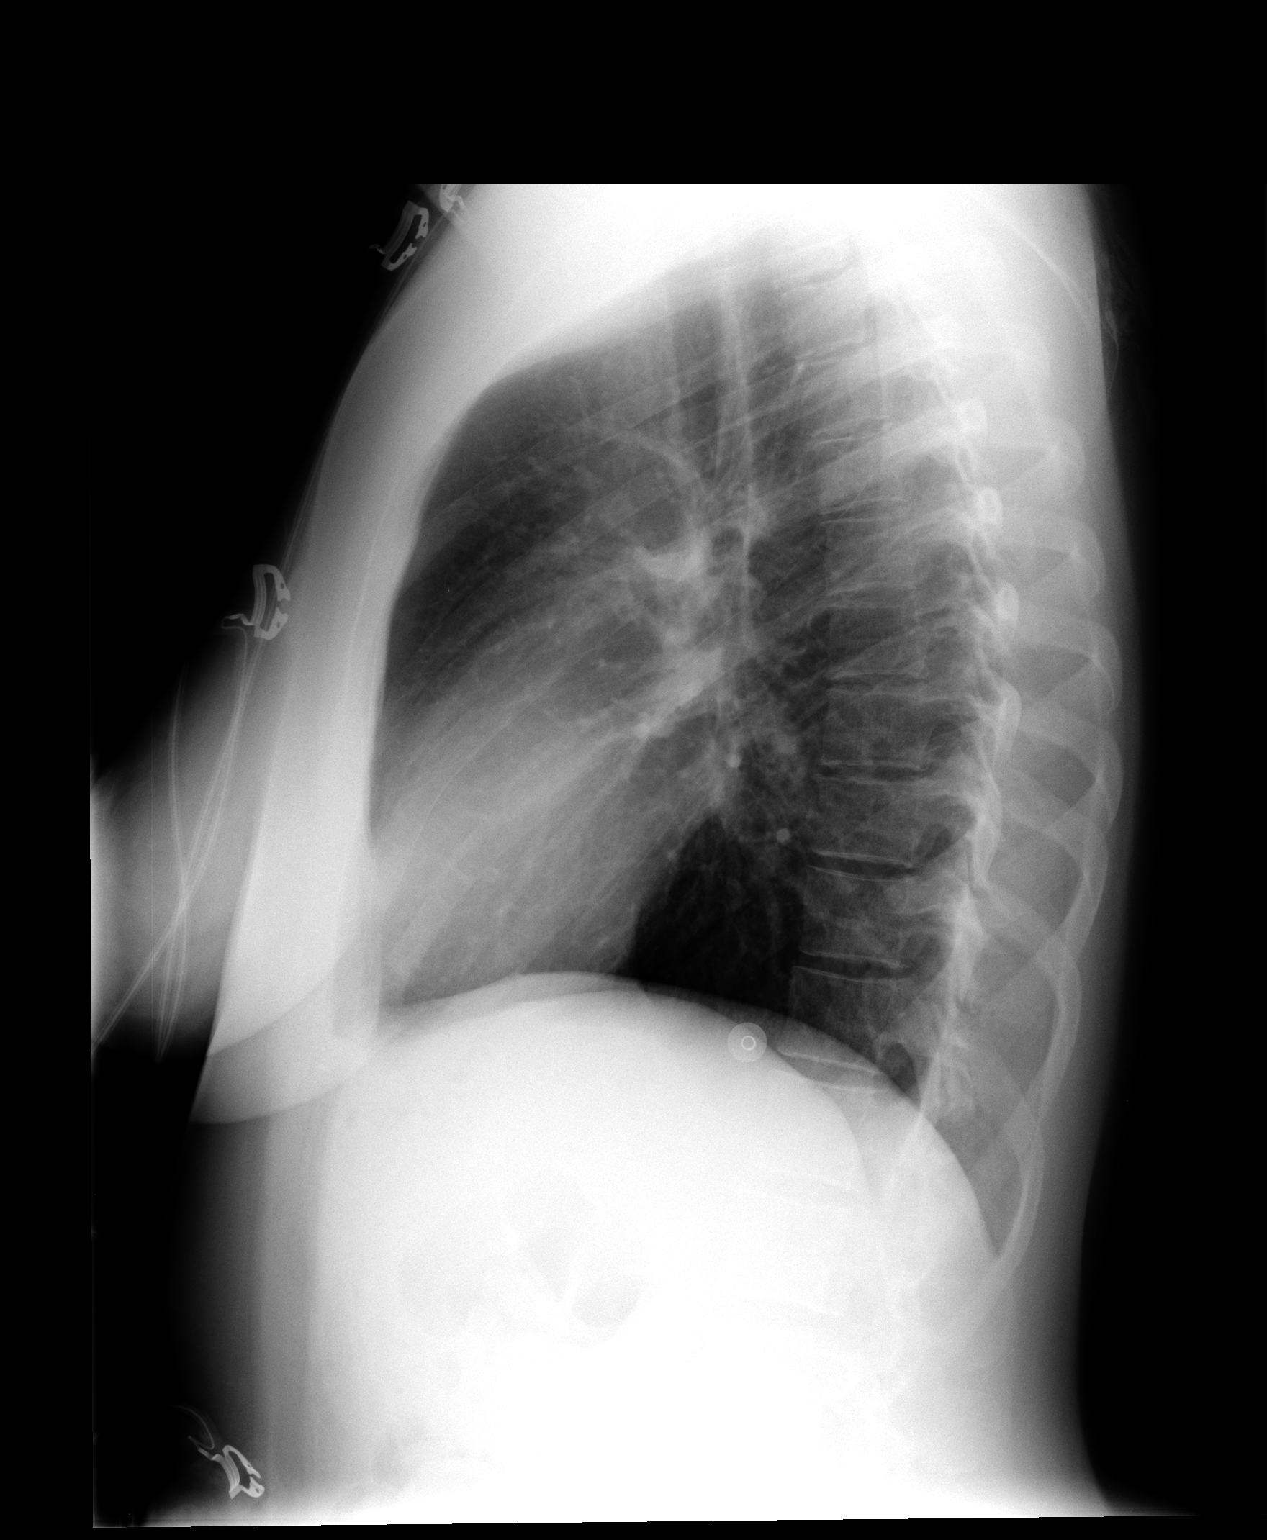

[2 of 2 positions shown; findings below may reference images not displayed]

FINDINGS: The heart size and mediastinal contours are within normal limits.
Both lungs are clear. The visualized skeletal structures are
unremarkable.
IMPRESSION: No active cardiopulmonary disease.

## 2016-09-19 ENCOUNTER — Encounter: Payer: Self-pay | Admitting: Adult Health

## 2016-09-19 ENCOUNTER — Other Ambulatory Visit (HOSPITAL_COMMUNITY)
Admission: RE | Admit: 2016-09-19 | Discharge: 2016-09-19 | Disposition: A | Discharge status: Home / Self Care | Payer: 59 | Source: Ambulatory Visit | Attending: Adult Health | Admitting: Adult Health

## 2016-09-19 ENCOUNTER — Ambulatory Visit (INDEPENDENT_AMBULATORY_CARE_PROVIDER_SITE_OTHER): Payer: 59 | Admitting: Adult Health

## 2016-09-19 VITALS — BP 120/70 | HR 76 | Ht 61.25 in | Wt 157.0 lb

## 2016-09-19 DIAGNOSIS — Z01419 Encounter for gynecological examination (general) (routine) without abnormal findings: Secondary | ICD-10-CM | POA: Insufficient documentation

## 2016-09-19 DIAGNOSIS — Z30011 Encounter for initial prescription of contraceptive pills: Secondary | ICD-10-CM | POA: Insufficient documentation

## 2016-09-19 DIAGNOSIS — R002 Palpitations: Secondary | ICD-10-CM | POA: Insufficient documentation

## 2016-09-19 DIAGNOSIS — Z1151 Encounter for screening for human papillomavirus (HPV): Secondary | ICD-10-CM | POA: Diagnosis present

## 2016-09-19 MED ORDER — NORETHIN-ETH ESTRAD-FE BIPHAS 1 MG-10 MCG / 10 MCG PO TABS: 1.0000 | ORAL_TABLET | Freq: Every day | ORAL | 4 refills | Status: DC

## 2016-09-19 NOTE — Progress Notes (Signed)
Patient ID: Alyssa Mason, female   DOB: 03/30/1986, 30 y.o.   MRN: [id number] History of Present Illness: Alyssa Mason is a 30 year old white female,married in for a well woman gyn exam and get on birth control. [facility]   Current Medications, Allergies, Past Medical History, Past Surgical History, Family History and Social History were reviewed in [facility] electronic medical record.     Review of Systems: Patient denies any headaches, hearing loss, fatigue, blurred vision, shortness of breath, chest pain, abdominal pain, problems with bowel movements, urination, or intercourse. No joint pain or mood swings.Has had heart palpitations for about 4 years now, wear monitor in past.    Physical Exam:BP 120/70 (BP Location: Left Arm, Patient Position: Sitting, Cuff Size: Normal)   Pulse 76   Ht 5' 1.25" (1.556 m)   Wt 157 lb (71.2 kg)   LMP 09/05/2016 (Approximate)   Breastfeeding? No   BMI 29.42 kg/m  General:  Well developed, well nourished, no acute distress Skin:  Warm and dry Neck:  Midline trachea, normal thyroid, good ROM, no lymphadenopathy Lungs; Clear to auscultation bilaterally Breast:  No dominant palpable mass, retraction, or nipple discharge Cardiovascular: Regular rate and rhythm Abdomen:  Soft, non tender, no hepatosplenomegaly Pelvic:  External genitalia is normal in appearance, no lesions.  The vagina is normal in appearance. Urethra has no lesions or masses. The cervix is bulbous.Pap with HPV performed.  Uterus is felt to be normal size, shape, and contour.  No adnexal masses or tenderness noted.Bladder is non tender, no masses felt. Extremities/musculoskeletal:  No swelling or varicosities noted, no clubbing or cyanosis Psych:  No mood changes, alert and cooperative,seems happy PHQ 2 score 0.Discussed could get cardiology consult, will check labs first.  Impression: 1. Encounter for gynecological examination with Papanicolaou smear of cervix   2. Encounter for  initial prescription of contraceptive pills   3. Heart palpitations       Plan:   Check CBC,CMP,TSH and lipids,A1c and vitamin D  Rx lo loestrin disp 3 packs, with 4 refills,take 1 daily Physical in 1 year, pap in 3 if normal

## 2016-09-19 NOTE — Patient Instructions (Signed)
Physical in 1 year, pap in 3 years if normal  

## 2016-09-20 LAB — COMPREHENSIVE METABOLIC PANEL
ALT: 42 IU/L — ABNORMAL HIGH (ref 0–32)
AST: 30 IU/L (ref 0–40)
Albumin/Globulin Ratio: 1.8 (ref 1.2–2.2)
Albumin: 4.4 g/dL (ref 3.5–5.5)
Alkaline Phosphatase: 85 IU/L (ref 39–117)
BUN/Creatinine Ratio: 13 (ref 9–23)
BUN: 10 mg/dL (ref 6–20)
Bilirubin Total: 0.3 mg/dL (ref 0.0–1.2)
CALCIUM: 9.5 mg/dL (ref 8.7–10.2)
CO2: 25 mmol/L (ref 18–29)
CREATININE: 0.8 mg/dL (ref 0.57–1.00)
Chloride: 101 mmol/L (ref 96–106)
GFR calc non Af Amer: 99 mL/min/{1.73_m2} (ref >59)
GFR, EST AFRICAN AMERICAN: 114 mL/min/{1.73_m2} (ref >59)
GLOBULIN, TOTAL: 2.5 g/dL (ref 1.5–4.5)
Glucose: 91 mg/dL (ref 65–99)
POTASSIUM: 4.6 mmol/L (ref 3.5–5.2)
SODIUM: 140 mmol/L (ref 134–144)
Total Protein: 6.9 g/dL (ref 6.0–8.5)

## 2016-09-20 LAB — CBC
HEMOGLOBIN: 12.8 g/dL (ref 11.1–15.9)
Hematocrit: 39.2 % (ref 34.0–46.6)
MCH: 27.6 pg (ref 26.6–33.0)
MCHC: 32.7 g/dL (ref 31.5–35.7)
MCV: 85 fL (ref 79–97)
Platelets: 265 10*3/uL (ref 150–379)
RBC: 4.64 x10E6/uL (ref 3.77–5.28)
RDW: 14.4 % (ref 12.3–15.4)
WBC: 6.4 10*3/uL (ref 3.4–10.8)

## 2016-09-20 LAB — HEMOGLOBIN A1C
Est. average glucose Bld gHb Est-mCnc: 108 mg/dL
Hgb A1c MFr Bld: 5.4 % (ref 4.8–5.6)

## 2016-09-20 LAB — LIPID PANEL
CHOL/HDL RATIO: 4.1 ratio (ref 0.0–4.4)
CHOLESTEROL TOTAL: 195 mg/dL (ref 100–199)
HDL: 48 mg/dL (ref >39)
LDL CALC: 122 mg/dL — AB (ref 0–99)
TRIGLYCERIDES: 123 mg/dL (ref 0–149)
VLDL CHOLESTEROL CAL: 25 mg/dL (ref 5–40)

## 2016-09-20 LAB — VITAMIN D 25 HYDROXY (VIT D DEFICIENCY, FRACTURES): Vit D, 25-Hydroxy: 25 ng/mL — ABNORMAL LOW (ref 30.0–100.0)

## 2016-09-20 LAB — TSH: TSH: 0.992 u[IU]/mL (ref 0.450–4.500)

## 2016-09-21 LAB — CYTOLOGY - PAP
Diagnosis: NEGATIVE
HPV (WINDOPATH): NOT DETECTED

## 2016-10-02 ENCOUNTER — Encounter: Payer: Self-pay | Admitting: Family Medicine

## 2016-10-02 ENCOUNTER — Ambulatory Visit (INDEPENDENT_AMBULATORY_CARE_PROVIDER_SITE_OTHER): Payer: 59 | Admitting: Family Medicine

## 2016-10-02 VITALS — BP 100/70 | Temp 97.7°F | Ht 61.0 in | Wt 157.0 lb

## 2016-10-02 DIAGNOSIS — J329 Chronic sinusitis, unspecified: Secondary | ICD-10-CM | POA: Diagnosis not present

## 2016-10-02 MED ORDER — ALBUTEROL SULFATE (2.5 MG/3ML) 0.083% IN NEBU: INHALATION_SOLUTION | RESPIRATORY_TRACT | 0 refills | Status: DC

## 2016-10-02 MED ORDER — AZITHROMYCIN 250 MG PO TABS: ORAL_TABLET | ORAL | 0 refills | Status: DC

## 2016-10-02 MED ORDER — ALBUTEROL SULFATE HFA 108 (90 BASE) MCG/ACT IN AERS: 2.0000 | INHALATION_SPRAY | Freq: Four times a day (QID) | RESPIRATORY_TRACT | 2 refills | Status: DC | PRN

## 2016-10-02 NOTE — Patient Instructions (Signed)
advil sinus or sudafed

## 2016-10-02 NOTE — Progress Notes (Signed)
   Subjective:    Patient ID: Alyssa Mason E Robidoux, female    DOB: 05/12/1986, 30 y.o.   MRN: [id number]  Cough  This is a new problem. The current episode started in the past 7 days. The problem has been unchanged. The cough is non-productive. Associated symptoms include chills, headaches, myalgias, a sore throat and wheezing. Nothing aggravates the symptoms. Treatments tried: tylenol flu. The treatment provided no relief.   Patient states that she has no other concerns at this time.  Bad sneezing non stop  Felt like sinus issue  Body aches and muscle aches  Feeling hot one minute and freeqing the nex  sonewhat achey  Off an on dim energy  Running Felt low gr fever    H a not too bad, some sore throt  Review of Systems  Constitutional: Positive for chills.  HENT: Positive for sore throat.   Respiratory: Positive for cough and wheezing.   Musculoskeletal: Positive for myalgias.  Neurological: Positive for headaches.       Objective:   Physical Exam  Alert, mild malaise. Hydration good Vitals stable. frontal/ maxillary tenderness evident positive nasal congestion. pharynx normal neck supple  lungs clear/no crackles or wheezes. heart regular in rhythm       Assessment & Plan:  Impression rhinosinusitis likely post viral, discussed with patient. plan antibiotics prescribed. Questions answered. Symptomatic care discussed. warning signs discussed. WSL

## 2017-05-06 ENCOUNTER — Emergency Department (HOSPITAL_COMMUNITY): Payer: BLUE CROSS/BLUE SHIELD

## 2017-05-06 ENCOUNTER — Emergency Department (HOSPITAL_COMMUNITY)
Admission: EM | Admit: 2017-05-06 | Discharge: 2017-05-06 | Disposition: A | Discharge status: Home / Self Care | Payer: BLUE CROSS/BLUE SHIELD | Attending: Emergency Medicine | Admitting: Emergency Medicine

## 2017-05-06 ENCOUNTER — Encounter (HOSPITAL_COMMUNITY): Payer: Self-pay | Admitting: *Deleted

## 2017-05-06 DIAGNOSIS — N83201 Unspecified ovarian cyst, right side: Secondary | ICD-10-CM

## 2017-05-06 DIAGNOSIS — R102 Pelvic and perineal pain: Secondary | ICD-10-CM | POA: Diagnosis not present

## 2017-05-06 DIAGNOSIS — Z79899 Other long term (current) drug therapy: Secondary | ICD-10-CM | POA: Diagnosis not present

## 2017-05-06 DIAGNOSIS — Z87891 Personal history of nicotine dependence: Secondary | ICD-10-CM | POA: Insufficient documentation

## 2017-05-06 DIAGNOSIS — J45909 Unspecified asthma, uncomplicated: Secondary | ICD-10-CM | POA: Insufficient documentation

## 2017-05-06 DIAGNOSIS — R1909 Other intra-abdominal and pelvic swelling, mass and lump: Secondary | ICD-10-CM | POA: Diagnosis not present

## 2017-05-06 LAB — URINALYSIS, ROUTINE W REFLEX MICROSCOPIC
Bilirubin Urine: NEGATIVE
Glucose, UA: NEGATIVE mg/dL
Hgb urine dipstick: NEGATIVE
Ketones, ur: NEGATIVE mg/dL
Leukocytes, UA: NEGATIVE
Nitrite: NEGATIVE
PROTEIN: NEGATIVE mg/dL
Specific Gravity, Urine: 1.02 (ref 1.005–1.030)
pH: 5 (ref 5.0–8.0)

## 2017-05-06 LAB — CBC
HEMATOCRIT: 38.2 % (ref 36.0–46.0)
Hemoglobin: 12.4 g/dL (ref 12.0–15.0)
MCH: 27.7 pg (ref 26.0–34.0)
MCHC: 32.5 g/dL (ref 30.0–36.0)
MCV: 85.3 fL (ref 78.0–100.0)
Platelets: 261 10*3/uL (ref 150–400)
RBC: 4.48 MIL/uL (ref 3.87–5.11)
RDW: 13.9 % (ref 11.5–15.5)
WBC: 15.5 10*3/uL — ABNORMAL HIGH (ref 4.0–10.5)

## 2017-05-06 LAB — COMPREHENSIVE METABOLIC PANEL
ALT: 22 U/L (ref 14–54)
AST: 21 U/L (ref 15–41)
Albumin: 4.4 g/dL (ref 3.5–5.0)
Alkaline Phosphatase: 64 U/L (ref 38–126)
Anion gap: 7 (ref 5–15)
BUN: 10 mg/dL (ref 6–20)
CHLORIDE: 103 mmol/L (ref 101–111)
CO2: 25 mmol/L (ref 22–32)
Calcium: 9.1 mg/dL (ref 8.9–10.3)
Creatinine, Ser: 0.75 mg/dL (ref 0.44–1.00)
GFR calc Af Amer: >60 mL/min (ref >60)
GFR calc non Af Amer: >60 mL/min (ref >60)
GLUCOSE: 130 mg/dL — AB (ref 65–99)
POTASSIUM: 3.6 mmol/L (ref 3.5–5.1)
SODIUM: 135 mmol/L (ref 135–145)
Total Bilirubin: 0.5 mg/dL (ref 0.3–1.2)
Total Protein: 7.7 g/dL (ref 6.5–8.1)

## 2017-05-06 LAB — DIFFERENTIAL
Basophils Absolute: 0 10*3/uL (ref 0.0–0.1)
Basophils Relative: 0 %
EOS PCT: 1 %
Eosinophils Absolute: 0.1 10*3/uL (ref 0.0–0.7)
LYMPHS PCT: 9 %
Lymphs Abs: 1.4 10*3/uL (ref 0.7–4.0)
MONO ABS: 0.5 10*3/uL (ref 0.1–1.0)
Monocytes Relative: 3 %
Neutro Abs: 13.4 10*3/uL — ABNORMAL HIGH (ref 1.7–7.7)
Neutrophils Relative %: 87 %

## 2017-05-06 LAB — WET PREP, GENITAL
Clue Cells Wet Prep HPF POC: NONE SEEN
SPERM: NONE SEEN
TRICH WET PREP: NONE SEEN
YEAST WET PREP: NONE SEEN

## 2017-05-06 LAB — PREGNANCY, URINE: PREG TEST UR: NEGATIVE

## 2017-05-06 LAB — LIPASE, BLOOD: LIPASE: 24 U/L (ref 11–51)

## 2017-05-06 MED ORDER — HYDROCODONE-ACETAMINOPHEN 5-325 MG PO TABS: ORAL_TABLET | ORAL | 0 refills | Status: DC

## 2017-05-06 MED ORDER — KETOROLAC TROMETHAMINE 60 MG/2ML IM SOLN
60.0000 mg | Freq: Once | INTRAMUSCULAR | Status: DC
  Filled 2017-05-06: qty 2

## 2017-05-06 MED ORDER — NAPROXEN 250 MG PO TABS: 250.0000 mg | ORAL_TABLET | Freq: Two times a day (BID) | ORAL | 0 refills | Status: DC | PRN

## 2017-05-06 NOTE — ED Notes (Signed)
Pt updated on wait time.  

## 2017-05-06 NOTE — ED Triage Notes (Signed)
Pt comes in with lower abdominal pain starting this morning. Pt has had nausea and vomiting. Pt states she has the urge to urinate and only a small amount is produced.

## 2017-05-06 NOTE — Discharge Instructions (Signed)
Take the prescriptions as directed.  Apply moist heat or ice to the area(s) of discomfort, for 15 minutes at a time, several times per day for the next few days.  Do not fall asleep on a heating or ice pack.  Call your regular OB/GYN doctor tomorrow to schedule a follow up appointment this week.  Return to the Emergency Department immediately if worsening. ° °

## 2017-05-06 NOTE — ED Notes (Signed)
Pt states her pain is minimal at this time.

## 2017-05-06 NOTE — ED Notes (Signed)
Went in to discharge patient and patient not found in room, gown found on bed

## 2017-05-06 NOTE — ED Provider Notes (Signed)
[facility] DEPT Provider Note   CSN: [id number] Arrival date & time: 05/06/17  1217     History   Chief Complaint Chief Complaint  Patient presents with  . Abdominal Pain    HPI Alyssa Mason is a 31 y.o. female.  HPI  Pt was seen at 1620.  Per pt, c/o gradual onset and persistence of constant lower abd "pain" that began this morning.  Has been associated with multiple intermittent episodes of N/V.  Describes the abd pain as "cramping."  Denies diarrhea, no dysuria/hematuria, no fevers, no back pain, no rash, no CP/SOB, no black or blood in stools or emesis.     [facility]: [facility] Past Medical History:  Diagnosis Date  . Asthma    prn albuterol  . H/O pyelonephritis   . H/O varicella   . History of kidney stones     Patient Active Problem List   Diagnosis Date Noted  . Encounter for initial prescription of contraceptive pills 09/19/2016  . Heart palpitations 09/19/2016    History reviewed. No pertinent surgical history.  OB History    Gravida Para Term Preterm AB Living   1 1 1  0 0 1   SAB TAB Ectopic Multiple Live Births   0 0 0 0 1       Home Medications    Prior to Admission medications   Medication Sig Start Date End Date Taking? Authorizing Provider  albuterol (PROVENTIL HFA;VENTOLIN HFA) 108 (90 Base) MCG/ACT inhaler Inhale 2 puffs into the lungs every 6 (six) hours as needed for wheezing. 10/02/16  Yes Merlyn Albert, MD  albuterol (PROVENTIL) (2.5 MG/3ML) 0.083% nebulizer solution inhale contents of 1 in nebulizer every 6 hours if needed 10/02/16  Yes Luking, Vilinda Blanks, MD    Family History Family History  Problem Relation Age of Onset  . COPD Mother   . Heart disease Father        heart murmur  . Hypertension Father   . Thyroid disease Father   . Rheum arthritis Father   . Thyroid disease Maternal Grandmother   . Colon cancer Maternal Grandmother   . Rheum arthritis Paternal Grandmother   . Heart disease Paternal Grandfather   .  Cancer Paternal Grandfather        lung    Social History Social History  Substance Use Topics  . Smoking status: Former Smoker    Packs/day: 0.50    Years: 10.00    Types: Cigarettes  . Smokeless tobacco: Never Used  . Alcohol use No     Allergies   Banana and Watermelon concentrate [citrullus vulgaris]   Review of Systems Review of Systems ROS: Statement: All systems negative except as marked or noted in the HPI; Constitutional: Negative for fever and chills. ; ; Eyes: Negative for eye pain, redness and discharge. ; ; ENMT: Negative for ear pain, hoarseness, nasal congestion, sinus pressure and sore throat. ; ; Cardiovascular: Negative for chest pain, palpitations, diaphoresis, dyspnea and peripheral edema. ; ; Respiratory: Negative for cough, wheezing and stridor. ; ; Gastrointestinal: +N/V, abd pain. Negative for diarrhea, blood in stool, hematemesis, jaundice and rectal bleeding. . ; ; Genitourinary: Negative for dysuria, flank pain and hematuria. ; ; GYN:  No pelvic pain, no vaginal bleeding, no vaginal discharge, no vulvar pain. ;; Musculoskeletal: Negative for back pain and neck pain. Negative for swelling and trauma.; ; Skin: Negative for pruritus, rash, abrasions, blisters, bruising and skin lesion.; ; Neuro: Negative for headache, lightheadedness and  neck stiffness. Negative for weakness, altered level of consciousness, altered mental status, extremity weakness, paresthesias, involuntary movement, seizure and syncope.       Physical Exam Updated Vital Signs BP 129/65   Pulse 75   Temp 98.4 F (36.9 C) (Oral)   Resp (!) 26   Ht 5\' 1"  (1.549 m)   Wt 70.3 kg (155 lb)   LMP 04/06/2017   SpO2 100%   BMI 29.29 kg/m   Physical Exam 1625: Physical examination:  Nursing notes reviewed; Vital signs and O2 SAT reviewed;  Constitutional: Well developed, Well nourished, Well hydrated, In no acute distress; Head:  Normocephalic, atraumatic; Eyes: EOMI, PERRL, No scleral  icterus; ENMT: Mouth and pharynx normal, Mucous membranes moist; Neck: Supple, Full range of motion, No lymphadenopathy; Cardiovascular: Regular rate and rhythm, No gallop; Respiratory: Breath sounds clear & equal bilaterally, No wheezes.  Speaking full sentences with ease, Normal respiratory effort/excursion; Chest: Nontender, Movement normal; Abdomen: Soft, +suprapubic area tender to palp. No rebound or guarding. Nondistended, Normal bowel sounds; Genitourinary: No CVA tenderness. Pelvic exam performed with permission of pt and female ED tech assist during exam.  External genitalia w/o lesions. Vaginal vault with thick white discharge.  Cervix w/o lesions, not friable, GC/chlam and wet prep obtained and sent to lab.  Bimanual exam w/o CMT, +mild uterine and bilateral adnexal tenderness.;;; Extremities: Pulses normal, No tenderness, No edema, No calf edema or asymmetry.; Neuro: AA&Ox3, Major CN grossly intact.  Speech clear. No gross focal motor or sensory deficits in extremities. Climbs on and off stretcher easily by herself. Gait steady.; Skin: Color normal, Warm, Dry.   ED Treatments / Results  Labs (all labs ordered are listed, but only abnormal results are displayed)   EKG  EKG Interpretation None       Radiology   Procedures Procedures (including critical care time)  Medications Ordered in ED Medications - No data to display   Initial Impression / Assessment and Plan / ED Course  I have reviewed the triage vital signs and the nursing notes.  Pertinent labs & imaging results that were available during my care of the patient were reviewed by me and considered in my medical decision making (see chart for details).  MDM Reviewed: previous chart, nursing note and vitals Reviewed previous: labs Interpretation: labs and ultrasound   Results for orders placed or performed during the hospital encounter of 05/06/17  Wet prep, genital  Result Value Ref Range   Yeast Wet Prep HPF  POC NONE SEEN NONE SEEN   Trich, Wet Prep NONE SEEN NONE SEEN   Clue Cells Wet Prep HPF POC NONE SEEN NONE SEEN   WBC, Wet Prep HPF POC RARE (A) NONE SEEN   Sperm NONE SEEN   Lipase, blood  Result Value Ref Range   Lipase 24 11 - 51 U/L  Comprehensive metabolic panel  Result Value Ref Range   Sodium 135 135 - 145 mmol/L   Potassium 3.6 3.5 - 5.1 mmol/L   Chloride 103 101 - 111 mmol/L   CO2 25 22 - 32 mmol/L   Glucose, Bld 130 (H) 65 - 99 mg/dL   BUN 10 6 - 20 mg/dL   Creatinine, Ser 4.09 0.44 - 1.00 mg/dL   Calcium 9.1 8.9 - 81.1 mg/dL   Total Protein 7.7 6.5 - 8.1 g/dL   Albumin 4.4 3.5 - 5.0 g/dL   AST 21 15 - 41 U/L   ALT 22 14 - 54 U/L   Alkaline Phosphatase  64 38 - 126 U/L   Total Bilirubin 0.5 0.3 - 1.2 mg/dL   GFR calc non Af Amer >60 >60 mL/min   GFR calc Af Amer >60 >60 mL/min   Anion gap 7 5 - 15  CBC  Result Value Ref Range   WBC 15.5 (H) 4.0 - 10.5 K/uL   RBC 4.48 3.87 - 5.11 MIL/uL   Hemoglobin 12.4 12.0 - 15.0 g/dL   HCT 16.1 09.6 - 04.5 %   MCV 85.3 78.0 - 100.0 fL   MCH 27.7 26.0 - 34.0 pg   MCHC 32.5 30.0 - 36.0 g/dL   RDW 40.9 81.1 - 91.4 %   Platelets 261 150 - 400 K/uL  Urinalysis, Routine w reflex microscopic  Result Value Ref Range   Color, Urine YELLOW YELLOW   APPearance HAZY (A) CLEAR   Specific Gravity, Urine 1.020 1.005 - 1.030   pH 5.0 5.0 - 8.0   Glucose, UA NEGATIVE NEGATIVE mg/dL   Hgb urine dipstick NEGATIVE NEGATIVE   Bilirubin Urine NEGATIVE NEGATIVE   Ketones, ur NEGATIVE NEGATIVE mg/dL   Protein, ur NEGATIVE NEGATIVE mg/dL   Nitrite NEGATIVE NEGATIVE   Leukocytes, UA NEGATIVE NEGATIVE   RBC / HPF 0-5 0 - 5 RBC/hpf   WBC, UA 0-5 0 - 5 WBC/hpf   Bacteria, UA RARE (A) NONE SEEN   Squamous Epithelial / LPF 0-5 (A) NONE SEEN   Mucous PRESENT   Differential  Result Value Ref Range   Neutrophils Relative % 87 %   Neutro Abs 13.4 (H) 1.7 - 7.7 K/uL   Lymphocytes Relative 9 %   Lymphs Abs 1.4 0.7 - 4.0 K/uL   Monocytes Relative  3 %   Monocytes Absolute 0.5 0.1 - 1.0 K/uL   Eosinophils Relative 1 %   Eosinophils Absolute 0.1 0.0 - 0.7 K/uL   Basophils Relative 0 %   Basophils Absolute 0.0 0.0 - 0.1 K/uL  Pregnancy, urine  Result Value Ref Range   Preg Test, Ur NEGATIVE NEGATIVE   US Transvaginal Non-ob Result Date: 05/06/2017 CLINICAL DATA:  Lower abdominal and pelvic pain with nausea and vomiting beginning today. LMP 04/06/2017. EXAM: TRANSABDOMINAL AND TRANSVAGINAL ULTRASOUND OF PELVIS DOPPLER ULTRASOUND OF OVARIES TECHNIQUE: Both transabdominal and transvaginal ultrasound examinations of the pelvis were performed. Transabdominal technique was performed for global imaging of the pelvis including uterus, ovaries, adnexal regions, and pelvic cul-de-sac. It was necessary to proceed with endovaginal exam following the transabdominal exam to visualize the endometrium and ovaries. Color and duplex Doppler ultrasound was utilized to evaluate blood flow to the ovaries. COMPARISON:  CT on 07/28/2011 FINDINGS: Uterus Measurements: 7.3 x 3.9 x 4.8 cm. No fibroids or other mass visualized. Endometrium Thickness: 7 mm. A single tiny cystic focus is seen in the mid portion of the endometrium which measures 3 mm. This could represent a tiny endometrial cyst or intrauterine gestational sac. Right ovary Measurements: 4.3 x 3.5 x 3.1 cm. A complex cyst is seen containing multiple thin septations and internal reticular echo pattern, without central blood flow on color Doppler ultrasound. This measures 3.4 x 2.2 by 2.3 cm, and has nonspecific but probably benign characteristics. This may represent a hemorrhagic cyst. Peripheral ovarian tissue does show internal blood flow on color Doppler ultrasound. Left ovary Measurements: 3.1 x 2.7 x 3.2 cm. Normal appearance/no adnexal mass. Pulsed Doppler evaluation of both ovaries demonstrates normal low-resistance arterial and venous waveforms. Other findings Small amount of free fluid noted. IMPRESSION: 3  mm cystic  focus of endometrium, which may represent a tiny endometrial cyst fourth intrauterine gestational sac. Recommend correlation with serum pregnancy test. 3.4 cm indeterminate but probably benign cystic lesion in the right ovary. This may represent a hemorrhagic cyst. Recommend correlation with serum pregnancy test. Consider short-term followup by transvaginal pelvic ultrasound in several weeks if pregnancy test is negative. Small amount of free pelvic fluid. No sonographic evidence for ovarian torsion. Electronically Signed   By: Myles RosenthalJohn  Stahl M.D.   On: 05/06/2017 17:43    1920:  Workup with negative pregnancy test, +ovarian cyst on US. Pt refused pain meds while in the ED, stating her pain was better. States she is ready to go home now. Dx and testing d/w pt and family.  Questions answered.  Verb understanding, agreeable to d/c home with outpt f/u with her OB/GYN.    Final Clinical Impressions(s) / ED Diagnoses   Final diagnoses:  Pelvic pain  Pelvic pain    New Prescriptions New Prescriptions   No medications on file     Samuel JesterMcManus, Takyla Kuchera, DO 05/10/17 1251

## 2017-05-07 LAB — GC/CHLAMYDIA PROBE AMP (~~LOC~~) NOT AT ARMC
Chlamydia: NEGATIVE
NEISSERIA GONORRHEA: NEGATIVE

## 2017-09-14 DIAGNOSIS — H04123 Dry eye syndrome of bilateral lacrimal glands: Secondary | ICD-10-CM | POA: Diagnosis not present

## 2017-09-14 DIAGNOSIS — H40033 Anatomical narrow angle, bilateral: Secondary | ICD-10-CM | POA: Diagnosis not present

## 2017-10-07 ENCOUNTER — Ambulatory Visit: Payer: BLUE CROSS/BLUE SHIELD | Admitting: Nurse Practitioner

## 2017-10-07 ENCOUNTER — Encounter: Payer: Self-pay | Admitting: Family Medicine

## 2017-10-07 ENCOUNTER — Encounter: Payer: Self-pay | Admitting: Nurse Practitioner

## 2017-10-07 VITALS — BP 118/82 | Temp 98.3°F | Ht 61.0 in | Wt 158.2 lb

## 2017-10-07 DIAGNOSIS — B9689 Other specified bacterial agents as the cause of diseases classified elsewhere: Secondary | ICD-10-CM | POA: Diagnosis not present

## 2017-10-07 DIAGNOSIS — J069 Acute upper respiratory infection, unspecified: Secondary | ICD-10-CM | POA: Diagnosis not present

## 2017-10-07 MED ORDER — AZITHROMYCIN 250 MG PO TABS: ORAL_TABLET | ORAL | 0 refills | Status: DC

## 2017-10-07 MED ORDER — ALBUTEROL SULFATE HFA 108 (90 BASE) MCG/ACT IN AERS: 2.0000 | INHALATION_SPRAY | RESPIRATORY_TRACT | 2 refills | Status: DC | PRN

## 2017-10-08 ENCOUNTER — Encounter: Payer: Self-pay | Admitting: Nurse Practitioner

## 2017-10-08 NOTE — Progress Notes (Signed)
Subjective: Presents for complaints of flulike symptoms that began 11/1.  Hot/cold.  Sore throat.  Frontal area headache.  Runny nose.  Frequent cough worse at night producing brown yellow sputum with slight blood at times.  Non-smoker.  Slight chest pain with deep breath or cough.  Ear pain.  No vomiting diarrhea or abdominal pain.  Taking fluids well.  Slight wheezing at times.  Has used albuterol inhaler twice and nebulizer once during illness.  Objective:   BP 118/82   Temp 98.3 F (36.8 C) (Oral)   Ht 5\' 1"  (1.549 m)   Wt 158 lb 3.2 oz (71.8 kg)   BMI 29.89 kg/m   NAD.  Alert, oriented.  TMs clear effusion, no erythema.  Pharynx injected with PND noted.  Neck supple with mild soft anterior adenopathy.  Lungs clear.  Occasional nonproductive cough noted.  No wheezing or tachypnea.  Heart regular rate rhythm.  Assessment:  Bacterial upper respiratory infection    Plan:   Meds ordered this encounter  Medications  . azithromycin (ZITHROMAX Z-PAK) 250 MG tablet    Sig: Take 2 tablets (500 mg) on  Day 1,  followed by 1 tablet (250 mg) once daily on Days 2 through 5.    Dispense:  6 each    Refill:  0    Order Specific Question:   Supervising Provider    Answer:   Merlyn AlbertLUKING, WILLIAM S [[id number]]  . albuterol (PROVENTIL HFA;VENTOLIN HFA) 108 (90 Base) MCG/ACT inhaler    Sig: Inhale 2 puffs every 4 (four) hours as needed into the lungs for wheezing.    Dispense:  1 Inhaler    Refill:  2    Order Specific Question:   Supervising Provider    Answer:   Merlyn AlbertLUKING, WILLIAM S [[id number]]   OTC meds as directed for congestion and cough.  Warning signs reviewed.Call back by the end of the week if no improvement, sooner if worse.

## 2017-12-10 ENCOUNTER — Encounter: Payer: Self-pay | Admitting: Nurse Practitioner

## 2017-12-10 ENCOUNTER — Ambulatory Visit: Payer: BLUE CROSS/BLUE SHIELD | Admitting: Nurse Practitioner

## 2017-12-10 VITALS — BP 122/80 | Ht 61.0 in | Wt 156.0 lb

## 2017-12-10 DIAGNOSIS — Z23 Encounter for immunization: Secondary | ICD-10-CM

## 2017-12-10 DIAGNOSIS — M67432 Ganglion, left wrist: Secondary | ICD-10-CM | POA: Diagnosis not present

## 2017-12-10 NOTE — Progress Notes (Signed)
Subjective: Presents for complaints of pain in the left wrist area for the past 2 weeks.  Has had a knot in this area for a while, has gotten slightly larger and more painful recently.  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  Does have a wrist brace which she has tried but think she is wearing it too tight.  Remote history of injury where she fell.  Unsure if this has anything to do with her pain today.    Objective:   BP 122/80   Ht 5\' 1"  (1.549 m)   Wt 156 lb (70.8 kg)   BMI 29.48 kg/m  NAD.  Alert, oriented.  Normal range of motion of the left wrist with minimal tenderness.  Strong radial pulse.  Sensation grossly intact.  Firm mobile cystic lesion noted on the dorsal left mid wrist area.  Mildly tender to palpation.  Assessment:  Ganglion cyst of wrist, left  Need for vaccination - Plan: Flu Vaccine QUAD 6+ mos PF IM (Fluarix Quad PF)    Plan: Use wrist brace especially while at work.  Advised patient not to use it too tightly to avoid pressing on the cyst.  Also OTC anti-inflammatories as directed.  Defers referral at this time but to call back if needed.

## 2017-12-10 NOTE — Patient Instructions (Addendum)
  Ibuprofen 200 mg 3-4 (600-800 mg) with food three times per day as needed    Ganglion Cyst A ganglion cyst is a noncancerous, fluid-filled lump that occurs near joints or tendons. The ganglion cyst grows out of a joint or the lining of a tendon. It most often develops in the hand or wrist, but it can also develop in the shoulder, elbow, hip, knee, ankle, or foot. The round or oval ganglion cyst can be the size of a pea or larger than a grape. Increased activity may enlarge the size of the cyst because more fluid starts to build up. What are the causes? It is not known what causes a ganglion cyst to grow. However, it may be related to:  Inflammation or irritation around the joint.  An injury.  Repetitive movements or overuse.  Arthritis.  What increases the risk? Risk factors include:  Being a woman.  Being age 32-50.  What are the signs or symptoms? Symptoms may include:  A lump. This most often appears on the hand or wrist, but it can occur in other areas of the body.  Tingling.  Pain.  Numbness.  Muscle weakness.  Weak grip.  Less movement in a joint.  How is this diagnosed? Ganglion cysts are most often diagnosed based on a physical exam. Your health care provider will feel the lump and may shine a light alongside it. If it is a ganglion cyst, a light often shines through it. Your health care provider may order an X-ray, ultrasound, or MRI to rule out other conditions. How is this treated? Ganglion cysts usually go away on their own without treatment. If pain or other symptoms are involved, treatment may be needed. Treatment is also needed if the ganglion cyst limits your movement or if it gets infected. Treatment may include:  Wearing a brace or splint on your wrist or finger.  Taking anti-inflammatory medicine.  Draining fluid from the lump with a needle (aspiration).  Injecting a steroid into the joint.  Surgery to remove the ganglion cyst.  Follow these  instructions at home:  Do not press on the ganglion cyst, poke it with a needle, or hit it.  Take medicines only as directed by your health care provider.  Wear your brace or splint as directed by your health care provider.  Watch your ganglion cyst for any changes.  Keep all follow-up visits as directed by your health care provider. This is important. Contact a health care provider if:  Your ganglion cyst becomes larger or more painful.  You have increased redness, red streaks, or swelling.  You have pus coming from the lump.  You have weakness or numbness in the affected area.  You have a fever or chills. This information is not intended to replace advice given to you by your health care provider. Make sure you discuss any questions you have with your health care provider. Document Released: 11/16/2000 Document Revised: 04/26/2016 Document Reviewed: 05/04/2014 Elsevier Interactive Patient Education  2018 [employer]

## 2018-04-10 ENCOUNTER — Other Ambulatory Visit: Payer: Self-pay

## 2018-04-10 ENCOUNTER — Ambulatory Visit (INDEPENDENT_AMBULATORY_CARE_PROVIDER_SITE_OTHER): Payer: BLUE CROSS/BLUE SHIELD | Admitting: Adult Health

## 2018-04-10 ENCOUNTER — Encounter: Payer: Self-pay | Admitting: Adult Health

## 2018-04-10 VITALS — BP 126/70 | HR 81 | Resp 18 | Ht 61.0 in | Wt 155.0 lb

## 2018-04-10 DIAGNOSIS — Z01419 Encounter for gynecological examination (general) (routine) without abnormal findings: Secondary | ICD-10-CM

## 2018-04-10 DIAGNOSIS — B379 Candidiasis, unspecified: Secondary | ICD-10-CM | POA: Diagnosis not present

## 2018-04-10 DIAGNOSIS — Z319 Encounter for procreative management, unspecified: Secondary | ICD-10-CM | POA: Diagnosis not present

## 2018-04-10 DIAGNOSIS — N926 Irregular menstruation, unspecified: Secondary | ICD-10-CM | POA: Insufficient documentation

## 2018-04-10 DIAGNOSIS — Z01818 Encounter for other preprocedural examination: Secondary | ICD-10-CM | POA: Insufficient documentation

## 2018-04-10 DIAGNOSIS — Z01411 Encounter for gynecological examination (general) (routine) with abnormal findings: Secondary | ICD-10-CM

## 2018-04-10 LAB — POCT WET PREP (WET MOUNT)
Clue Cells Wet Prep Whiff POC: NEGATIVE
WBC, Wet Prep HPF POC: POSITIVE

## 2018-04-10 LAB — POCT URINE PREGNANCY: Preg Test, Ur: NEGATIVE

## 2018-04-10 MED ORDER — CLOMIPHENE CITRATE 50 MG PO TABS: 50.0000 mg | ORAL_TABLET | Freq: Every day | ORAL | 2 refills | Status: DC

## 2018-04-10 MED ORDER — FLUCONAZOLE 150 MG PO TABS: ORAL_TABLET | ORAL | 0 refills | Status: DC

## 2018-04-10 NOTE — Progress Notes (Signed)
Patient ID: Alyssa Mason, female   DOB: 13-Nov-1986, 32 y.o.   MRN: [id number] History of Present Illness: Alyssa Mason is a 32 year old white female,married, in for well woman gyn exam,she had normal pap with negative HPV 09/19/16.  PCP is C. Isabell Jarvis, FNP.   Current Medications, Allergies, Past Medical History, Past Surgical History, Family History and Social History were reviewed in [name] record.     Review of Systems: Patient denies any headaches, hearing loss, fatigue, blurred vision, shortness of breath, chest pain, abdominal pain, problems with bowel movements, urination, or intercourse. No joint pain or mood swings. Irregular periods,wants to get pregnant had to use clomid last time    Physical Exam:BP 126/70 (BP Location: Right Arm, Patient Position: Sitting, Cuff Size: Normal)   Pulse 81   Resp 18   Ht  (1.549 m)   Wt 155 lb (70.3 kg)   LMP 01/15/2018   BMI 29.29 kg/m UPT negative. General:  Well developed, well nourished, no acute distress Skin:  Warm and dry Neck:  Midline trachea, normal thyroid, good ROM, no lymphadenopathy Lungs; Clear to auscultation bilaterally Breast:  No dominant palpable mass, retraction, or nipple discharge Cardiovascular: Regular rate and rhythm Abdomen:  Soft, non tender, no hepatosplenomegaly Pelvic:  External genitalia is normal in appearance, no lesions.  The vagina is normal in appearance.+creamy discharge without odor. Urethra has no lesions or masses. The cervix is bulbous.  Uterus is felt to be normal size, shape, and contour.  No adnexal masses or tenderness noted.Bladder is non tender, no masses felt. Wet prep:+WBC and yeast. Extremities/musculoskeletal:  No swelling or varicosities noted, no clubbing or cyanosis Psych:  No mood changes, alert and cooperative,seems happy PHQ 2 score 0.  Impression: 1. Encounter for well woman exam with routine gynecological exam   2. Yeast infection   3. Irregular periods    4. Patient desires pregnancy       Plan: Meds ordered this encounter  Medications  . fluconazole (DIFLUCAN) 150 MG tablet    Sig: Take 1 now and repeat 1 in 3 days if needed    Dispense:  2 tablet    Refill:  0    Order Specific Question:   Supervising Provider    Answer:   Despina Hidden, LUTHER H [[id number]]  . clomiPHENE (CLOMID) 50 MG tablet    Sig: Take 1 tablet (50 mg total) by mouth daily. Days 3-7 of cycle    Dispense:  5 tablet    Refill:  2    Order Specific Question:   Supervising Provider    Answer:   Lazaro Arms [[id number]]  Start clomid today and have sex every other day,days 7-24 of cycle, pee before and lay there 30 minutes after Check progesterone level 5/28 Take OTC PNV Pap and physical in 1 year

## 2018-04-10 NOTE — Patient Instructions (Signed)
CLOMID INSTRUCTIONS  WHY USE IT? Clomid helps your ovaries to release eggs (ovulate).  HOW TO USE IT? Clomid is taken as a pill usually on days 5,6,7,8, & 9 of your cycle.  Day 1 is the first day of your period. The dose or duration may be changed to achieve ovulation.  Provera (progesterone) may first be used to bring on a period for some patients. The day of ovulation on Clomid is usually between cycle day 14 and 17.  Having sexual intercourse at least every other day between cycle day 13 and 18 will improve your chances of becoming pregnant during the Clomid cycle.  You may monitor your ovulation using basal body temperature charts or with ovulation kits.  If using the ovulation predictor kits, having intercourse the day of the surge and the two days following is recommended. If you get your period, call when it starts for an appointment with your doctor, so that an exam may be done, and another Clomid cycle can be considered if appropriate. If you do not get a period by day 35 of the cycle, please get a blood pregnancy test.  If it is negative, speak to your doctor for instructions to bring on another period and to plan a follow-up appointment.  THINGS TO KNOW: If you get pregnant while using Clomid, your chance of twins is 7%m and triplets is less than 1%. Some studies have suggested the use of "fertility drugs" may increase your risk of ovarian cancers in the future.  It is unclear if these drugs increase the risk, or people who have problems with fertility are prone for these cancers.  If there is an actual risk, it is very low.  If you have a history of liver problems or ovarian cancer, it may be wise to avoid this medication.  SIDE EFFECTS: The most common side effect is hot flashes (20%). Breast tenderness, headaches, nausea, bloating may also occur at different times. Less than 3/1,000 people have dryness or loss of hair. Persistent ovarian cysts may form from the use of this  medication. Ovarian hyperstimulation syndrome is a rare side effect at low doses. Visual changes like flashes of light or blurring.   

## 2018-04-21 ENCOUNTER — Telehealth: Payer: Self-pay | Admitting: Adult Health

## 2018-04-21 NOTE — Telephone Encounter (Signed)
Pt states that she has questions for Victorino Dike about the clomid.

## 2018-04-21 NOTE — Telephone Encounter (Signed)
Mailbox full

## 2018-04-21 NOTE — Telephone Encounter (Signed)
Spotting some, took clomid this month

## 2018-04-22 ENCOUNTER — Telehealth: Payer: Self-pay | Admitting: Adult Health

## 2018-04-22 DIAGNOSIS — Z319 Encounter for procreative management, unspecified: Secondary | ICD-10-CM

## 2018-04-22 NOTE — Telephone Encounter (Signed)
Period started yesterday, check progesterone level 6/10, take clomid days 3-7 of cycle

## 2018-04-22 NOTE — Telephone Encounter (Signed)
Patient called and spoke with Margaretha Sheffield and she still has question today. Pt would like a call from [place].

## 2018-05-12 DIAGNOSIS — Z319 Encounter for procreative management, unspecified: Secondary | ICD-10-CM | POA: Diagnosis not present

## 2018-05-13 LAB — PROGESTERONE: PROGESTERONE: 11.3 ng/mL

## 2018-05-20 ENCOUNTER — Telehealth: Payer: Self-pay | Admitting: *Deleted

## 2018-05-20 ENCOUNTER — Encounter: Payer: Self-pay | Admitting: Adult Health

## 2018-05-20 DIAGNOSIS — N926 Irregular menstruation, unspecified: Secondary | ICD-10-CM

## 2018-05-20 NOTE — Telephone Encounter (Signed)
Spotted Saturday Sunday and Monday, check QHCG if negative take clomid this month

## 2018-05-20 NOTE — Telephone Encounter (Signed)
Left message that I called her back, and would be out of the office this pm

## 2018-06-22 ENCOUNTER — Encounter: Payer: Self-pay | Admitting: Adult Health

## 2018-06-27 ENCOUNTER — Encounter: Payer: Self-pay | Admitting: Adult Health

## 2018-07-01 ENCOUNTER — Ambulatory Visit: Payer: BLUE CROSS/BLUE SHIELD | Admitting: Adult Health

## 2018-07-01 ENCOUNTER — Encounter: Payer: Self-pay | Admitting: Adult Health

## 2018-07-01 VITALS — BP 138/81 | HR 78 | Ht 61.0 in | Wt 154.0 lb

## 2018-07-01 DIAGNOSIS — R109 Unspecified abdominal pain: Secondary | ICD-10-CM

## 2018-07-01 DIAGNOSIS — Z3201 Encounter for pregnancy test, result positive: Secondary | ICD-10-CM | POA: Diagnosis not present

## 2018-07-01 DIAGNOSIS — O200 Threatened abortion: Secondary | ICD-10-CM | POA: Diagnosis not present

## 2018-07-01 DIAGNOSIS — N938 Other specified abnormal uterine and vaginal bleeding: Secondary | ICD-10-CM | POA: Diagnosis not present

## 2018-07-01 DIAGNOSIS — O209 Hemorrhage in early pregnancy, unspecified: Secondary | ICD-10-CM | POA: Diagnosis not present

## 2018-07-01 LAB — POCT URINE PREGNANCY: PREG TEST UR: POSITIVE — AB

## 2018-07-01 NOTE — Progress Notes (Signed)
  Subjective:     Patient ID: Alyssa Mason E Hinely, female   DOB: 03/11/1986, 32 y.o.   MRN: [id number]  HPI [name] is a 32 year old white female in for UPT, she took clomid and has had +HPTs.She had brown spotting the weekend, and woke up at 2 am this morning, with bright red bleeding and clots and cramps.   Review of Systems +bright red vaginal bleeding with clots since about 2 am, was spotting brown the weekend +cramps in back Reviewed past medical,surgical, social and family history. Reviewed medications and allergies.     Objective:   Physical Exam BP 138/81 (BP Location: Left Arm, Patient Position: Sitting, Cuff Size: Normal)   Pulse 78   Ht 5\' 1"  (1.549 m)   Wt 154 lb (69.9 kg)   LMP 05/20/2018 (LMP Unknown)   BMI 29.10 kg/m UPT, faintly +, would be about 6 weeks by LMP with EDD 02/24/19.Skin warm and dry. Lungs: clear to ausculation bilaterally. Cardiovascular: regular rate and rhythm. Pelvic deferred, she says she is bleeding bright red with clots and it is heavy. Blood type is A+.  Discussed this is probably early miscarriage, will check labs and US to assess uterus.     Assessment:     1. Pregnancy test positive   2. Bleeding in early pregnancy   3. Threatened abortion       Plan:     Check QHCG today for baseline Return tomorrow for US at 2 pm Take PNV Push fluids today

## 2018-07-02 ENCOUNTER — Ambulatory Visit (INDEPENDENT_AMBULATORY_CARE_PROVIDER_SITE_OTHER): Payer: BLUE CROSS/BLUE SHIELD

## 2018-07-02 DIAGNOSIS — O209 Hemorrhage in early pregnancy, unspecified: Secondary | ICD-10-CM

## 2018-07-02 DIAGNOSIS — O200 Threatened abortion: Secondary | ICD-10-CM | POA: Diagnosis not present

## 2018-07-02 LAB — BETA HCG QUANT (REF LAB): hCG Quant: 18 m[IU]/mL

## 2018-07-02 NOTE — Progress Notes (Signed)
US NW:[id number]:homogeneous anteverted uterus,wnl,no IUP visualized,EEC 4 mm,normal ovaries bilat,Jennifer discussed results w/pt.

## 2018-09-16 ENCOUNTER — Ambulatory Visit: Payer: BLUE CROSS/BLUE SHIELD | Admitting: Family Medicine

## 2018-09-16 ENCOUNTER — Encounter: Payer: Self-pay | Admitting: Family Medicine

## 2018-09-16 VITALS — BP 122/80 | HR 86 | Ht 61.0 in | Wt 157.6 lb

## 2018-09-16 DIAGNOSIS — R300 Dysuria: Secondary | ICD-10-CM | POA: Diagnosis not present

## 2018-09-16 DIAGNOSIS — N3000 Acute cystitis without hematuria: Secondary | ICD-10-CM

## 2018-09-16 LAB — POCT URINALYSIS DIP (MANUAL ENTRY)
BILIRUBIN UA: NEGATIVE mg/dL
Bilirubin, UA: NEGATIVE
Blood, UA: NEGATIVE
GLUCOSE UA: NEGATIVE mg/dL
LEUKOCYTES UA: NEGATIVE
Nitrite, UA: NEGATIVE
Protein Ur, POC: NEGATIVE mg/dL
SPEC GRAV UA: 1.025 (ref 1.010–1.025)
Urobilinogen, UA: 0.2 E.U./dL
pH, UA: 5 (ref 5.0–8.0)

## 2018-09-16 MED ORDER — NITROFURANTOIN MONOHYD MACRO 100 MG PO CAPS: 100.0000 mg | ORAL_CAPSULE | Freq: Two times a day (BID) | ORAL | 0 refills | Status: AC

## 2018-09-16 NOTE — Progress Notes (Signed)
   Subjective:    Patient ID: Alyssa Mason, female    DOB: 05-May-1986, 32 y.o.   MRN: [id number]  HPI Pt here today c/o dysuria x 4 days, bilateral lower back pain. Denies hematuria, abnormal urine odor, abdominal pain. Pt has not tried any otc medication for sxs. Reports vaginal irritation but denies discharge or itching.    Review of Systems  Constitutional: Negative for chills and fever.  Gastrointestinal: Negative for nausea.  Genitourinary: Positive for dysuria. Negative for frequency, pelvic pain and urgency.       Objective:   Physical Exam  Constitutional: She is oriented to person, place, and time. She appears well-developed and well-nourished. No distress.  HENT:  Head: Normocephalic and atraumatic.  Neck: Neck supple.  Cardiovascular: Normal rate, regular rhythm and normal heart sounds.  Pulmonary/Chest: Effort normal and breath sounds normal. No respiratory distress.  Abdominal: Soft. There is no tenderness.  Lymphadenopathy:    She has no cervical adenopathy.  Neurological: She is alert and oriented to person, place, and time.  Skin: Skin is warm and dry.  Nursing note and vitals reviewed.  Results for orders placed or performed in visit on 09/16/18  POCT urinalysis dipstick  Result Value Ref Range   Color, UA yellow yellow   Clarity, UA hazy (A) clear   Glucose, UA negative negative mg/dL   Bilirubin, UA negative negative   Ketones, POC UA negative negative mg/dL   Spec Grav, UA 1.191 4.782 - 1.025   Blood, UA negative negative   pH, UA 5.0 5.0 - 8.0   Protein Ur, POC negative negative mg/dL   Urobilinogen, UA 0.2 0.2 or 1.0 E.U./dL   Nitrite, UA Negative Negative   Leukocytes, UA Negative Negative   Microscopic urine exam: 3-5 WBCs per HPF     Assessment & Plan:  Acute cystitis without hematuria  Dysuria - Plan: POCT urinalysis dipstick, Urine Culture  UTI as evidenced by pt symptoms and microscopic evaluation. Will send urine for culture. Treat  with macrobid 100 mg bid x 5 days. Warning signs discussed, f/u if symptoms worsen or fail to improve.  Dr. Lorin Picket was consulted on this visit and is in agreement with the above plan.

## 2018-09-18 ENCOUNTER — Other Ambulatory Visit: Payer: Self-pay | Admitting: *Deleted

## 2018-09-18 ENCOUNTER — Ambulatory Visit: Payer: BLUE CROSS/BLUE SHIELD | Admitting: Advanced Practice Midwife

## 2018-09-18 LAB — URINE CULTURE

## 2018-09-18 LAB — SPECIMEN STATUS REPORT

## 2018-09-18 MED ORDER — AMOXICILLIN 500 MG PO CAPS: 500.0000 mg | ORAL_CAPSULE | Freq: Three times a day (TID) | ORAL | 0 refills | Status: DC

## 2018-09-19 MED ORDER — AMOXICILLIN 500 MG PO TABS: ORAL_TABLET | ORAL | 0 refills | Status: DC

## 2018-09-19 NOTE — Addendum Note (Signed)
Addended by: Meredith Leeds on: 09/19/2018 11:02 AM   Modules accepted: Orders

## 2018-11-28 ENCOUNTER — Ambulatory Visit: Payer: BLUE CROSS/BLUE SHIELD | Admitting: Family Medicine

## 2018-11-28 ENCOUNTER — Encounter: Payer: Self-pay | Admitting: Family Medicine

## 2018-11-28 VITALS — Temp 98.5°F | Wt 164.2 lb

## 2018-11-28 DIAGNOSIS — J4521 Mild intermittent asthma with (acute) exacerbation: Secondary | ICD-10-CM

## 2018-11-28 MED ORDER — ALBUTEROL SULFATE HFA 108 (90 BASE) MCG/ACT IN AERS: 2.0000 | INHALATION_SPRAY | RESPIRATORY_TRACT | 2 refills | Status: AC | PRN

## 2018-11-28 MED ORDER — PREDNISONE 20 MG PO TABS: ORAL_TABLET | ORAL | 0 refills | Status: DC

## 2018-11-28 NOTE — Progress Notes (Signed)
   Subjective:    Patient ID: Alyssa Mason, female    DOB: 11/04/1986, 32 y.o.   MRN: [id number]  Asthma  She complains of cough and wheezing. There is no shortness of breath. This is a recurrent problem. The current episode started in the past 7 days. The cough is non-productive and dry. Associated symptoms include rhinorrhea. Pertinent negatives include no chest pain, ear pain or fever. Improvement on treatment: has tried Nebulizer; last only a few hours. Her past medical history is significant for asthma.   Patient has a history of asthma has not had any major flareups lately has not had any ER visits but relates over the past several days coughing congestion shortness of breath feels like she cannot get a good deep breath   Review of Systems  Constitutional: Negative for activity change and fever.  HENT: Positive for congestion and rhinorrhea. Negative for ear pain.   Eyes: Negative for discharge.  Respiratory: Positive for cough and wheezing. Negative for shortness of breath.   Cardiovascular: Negative for chest pain.       Objective:   Physical Exam Vitals signs and nursing note reviewed.  Constitutional:      Appearance: She is well-developed.  HENT:     Head: Normocephalic.     Nose: Nose normal.     Mouth/Throat:     Pharynx: No oropharyngeal exudate.  Neck:     Musculoskeletal: Neck supple.  Cardiovascular:     Rate and Rhythm: Normal rate.     Heart sounds: Normal heart sounds. No murmur.  Pulmonary:     Effort: Pulmonary effort is normal.     Breath sounds: Wheezing present.  Lymphadenopathy:     Cervical: No cervical adenopathy.  Skin:    General: Skin is warm and dry.     And occasional wheeze noted no respiratory distress      Assessment & Plan:  Asthma flareup Prednisone taper Albuterol as needed Warning signs were discussed in detail No need for any type of antibiotics currently X-rays lab work not indicated currently Follow-up if progressive  troubles or worse

## 2018-12-01 ENCOUNTER — Encounter (HOSPITAL_COMMUNITY): Payer: Self-pay | Admitting: Emergency Medicine

## 2018-12-01 ENCOUNTER — Emergency Department (HOSPITAL_COMMUNITY): Payer: BLUE CROSS/BLUE SHIELD

## 2018-12-01 ENCOUNTER — Other Ambulatory Visit: Payer: Self-pay

## 2018-12-01 ENCOUNTER — Emergency Department (HOSPITAL_COMMUNITY)
Admission: EM | Admit: 2018-12-01 | Discharge: 2018-12-01 | Disposition: A | Discharge status: Home / Self Care | Payer: BLUE CROSS/BLUE SHIELD | Attending: Emergency Medicine | Admitting: Emergency Medicine

## 2018-12-01 DIAGNOSIS — Z79899 Other long term (current) drug therapy: Secondary | ICD-10-CM | POA: Diagnosis not present

## 2018-12-01 DIAGNOSIS — R0602 Shortness of breath: Secondary | ICD-10-CM | POA: Diagnosis not present

## 2018-12-01 DIAGNOSIS — J45909 Unspecified asthma, uncomplicated: Secondary | ICD-10-CM | POA: Diagnosis not present

## 2018-12-01 DIAGNOSIS — R0789 Other chest pain: Secondary | ICD-10-CM | POA: Diagnosis not present

## 2018-12-01 DIAGNOSIS — Z87891 Personal history of nicotine dependence: Secondary | ICD-10-CM | POA: Insufficient documentation

## 2018-12-01 DIAGNOSIS — R05 Cough: Secondary | ICD-10-CM | POA: Diagnosis not present

## 2018-12-01 LAB — BASIC METABOLIC PANEL
Anion gap: 7 (ref 5–15)
BUN: 12 mg/dL (ref 6–20)
CHLORIDE: 106 mmol/L (ref 98–111)
CO2: 25 mmol/L (ref 22–32)
CREATININE: 0.64 mg/dL (ref 0.44–1.00)
Calcium: 9.1 mg/dL (ref 8.9–10.3)
GFR calc Af Amer: >60 mL/min (ref >60)
GFR calc non Af Amer: >60 mL/min (ref >60)
Glucose, Bld: 97 mg/dL (ref 70–99)
Potassium: 3.4 mmol/L — ABNORMAL LOW (ref 3.5–5.1)
SODIUM: 138 mmol/L (ref 135–145)

## 2018-12-01 LAB — CBC
HCT: 39 % (ref 36.0–46.0)
HEMOGLOBIN: 12.1 g/dL (ref 12.0–15.0)
MCH: 27.3 pg (ref 26.0–34.0)
MCHC: 31 g/dL (ref 30.0–36.0)
MCV: 87.8 fL (ref 80.0–100.0)
Platelets: 315 10*3/uL (ref 150–400)
RBC: 4.44 MIL/uL (ref 3.87–5.11)
RDW: 13.9 % (ref 11.5–15.5)
WBC: 13.9 10*3/uL — ABNORMAL HIGH (ref 4.0–10.5)
nRBC: 0 % (ref 0.0–0.2)

## 2018-12-01 MED ORDER — ALBUTEROL SULFATE (2.5 MG/3ML) 0.083% IN NEBU
5.0000 mg | INHALATION_SOLUTION | Freq: Once | RESPIRATORY_TRACT | Status: AC
  Administered 2018-12-01: 5 mg via RESPIRATORY_TRACT
  Filled 2018-12-01: qty 6

## 2018-12-01 NOTE — Discharge Instructions (Signed)
Continue your current medications, follow-up with your doctor later this week if the symptoms are not improving

## 2018-12-01 NOTE — ED Notes (Signed)
resp paged for breathing treatment,  

## 2018-12-01 NOTE — ED Triage Notes (Signed)
PT c/o dry non-productive cough, tightness in chest unrelieved by prednisone prescribed by her PCP this past Friday. PT states hx of asthma and has been using her nebulizer machine without any relief.

## 2018-12-01 NOTE — ED Provider Notes (Signed)
[facility] Provider Note   CSN: [id number] Arrival date & time: 12/01/18  0707     History   Chief Complaint Chief Complaint  Patient presents with  . Shortness of Breath    HPI Alyssa SharkKelly E Mason is a 32 y.o. female.  HPI Patient presented to the emergency room for evaluation of cough and chest tightness.  Patient states her symptoms started last week.  She felt like she was wheezing.  At that time she had a dry nonproductive cough and her chest felt tight.  She saw her primary care doctor and was given a prescription for prednisone.  Patient had some effects with the prednisone so she called her primary doctor and they decreased the dose.  Patient states she continues to have the symptoms.  She does not feel like she is wheezing right now but her chest still feels tight.  She denies any leg swelling.  No fevers.  She does not have any history of PE or DVT.  She does not take any estrogen medications. Past Medical History:  Diagnosis Date  . Asthma    prn albuterol  . Chronic kidney disease    kidney stones  . H/O pyelonephritis   . H/O varicella   . History of kidney stones     Patient Active Problem List   Diagnosis Date Noted  . Pregnancy test positive 07/01/2018  . Bleeding in early pregnancy 07/01/2018  . Threatened abortion 07/01/2018  . Patient desires pregnancy 04/10/2018  . Irregular periods 04/10/2018  . Yeast infection 04/10/2018  . Encounter for well woman exam with routine gynecological exam 04/10/2018  . Encounter for initial prescription of contraceptive pills 09/19/2016  . Heart palpitations 09/19/2016    History reviewed. No pertinent surgical history.   OB History    Gravida  2   Para  1   Term  1   Preterm  0   AB  0   Living  1     SAB  0   TAB  0   Ectopic  0   Multiple  0   Live Births  1            Home Medications    Prior to Admission medications   Medication Sig Start Date End Date Taking?  Authorizing Provider  albuterol (PROVENTIL HFA;VENTOLIN HFA) 108 (90 Base) MCG/ACT inhaler Inhale 2 puffs into the lungs every 4 (four) hours as needed for wheezing. 11/28/18   Babs SciaraLuking, Scott A, MD  albuterol (PROVENTIL) (2.5 MG/3ML) 0.083% nebulizer solution inhale contents of 1 in nebulizer every 6 hours if needed 10/02/16   Merlyn AlbertLuking, William S, MD  amoxicillin (AMOXIL) 500 MG capsule Take 1 capsule (500 mg total) by mouth 3 (three) times daily. 09/18/18   Babs SciaraLuking, Scott A, MD  amoxicillin (AMOXIL) 500 MG tablet Take one tablet three times per day for seven days 09/19/18   Jeannine BogaWeekley, Lindsay, NP  cetirizine (ZYRTEC) 10 MG tablet Take 10 mg by mouth daily.    [provider]  clomiPHENE (CLOMID) 50 MG tablet Take 1 tablet (50 mg total) by mouth daily. Days 3-7 of cycle 04/10/18   Adline PotterGriffin, Jennifer A, NP  fluconazole (DIFLUCAN) 150 MG tablet Take 1 now and repeat 1 in 3 days if needed 04/10/18   Adline PotterGriffin, Jennifer A, NP  predniSONE (DELTASONE) 20 MG tablet 3qd for 3d then 2qd for 3d then 1qd for 3d 11/28/18   Babs SciaraLuking, Scott A, MD    Family History  Family History  Problem Relation Age of Onset  . COPD Mother   . Heart disease Father        heart murmur  . Hypertension Father   . Thyroid disease Father   . Rheum arthritis Father   . Thyroid disease Maternal Grandmother   . Colon cancer Maternal Grandmother   . Rheum arthritis Paternal Grandmother   . Heart disease Paternal Grandfather   . Cancer Paternal Grandfather        lung    Social History Social History   Tobacco Use  . Smoking status: Former Smoker    Packs/day: 0.50    Years: 10.00    Pack years: 5.00    Types: Cigarettes  . Smokeless tobacco: Never Used  Substance Use Topics  . Alcohol use: No  . Drug use: No     Allergies   Banana and Watermelon concentrate [citrullus vulgaris]   Review of Systems Review of Systems  All other systems reviewed and are negative.    Physical Exam Updated Vital Signs BP  134/74 (BP Location: Right Arm)   Pulse 71   Temp 98 F (36.7 C) (Oral)   Resp 18   Ht 1.549 m (5\' 1" )   Wt 72.6 kg   LMP 11/01/2018   SpO2 100%   BMI 30.23 kg/m   Physical Exam Vitals signs and nursing note reviewed.  Constitutional:      General: She is not in acute distress.    Appearance: She is well-developed.  HENT:     Head: Normocephalic and atraumatic.     Right Ear: External ear normal.     Left Ear: External ear normal.  Eyes:     General: No scleral icterus.       Right eye: No discharge.        Left eye: No discharge.     Conjunctiva/sclera: Conjunctivae normal.  Neck:     Musculoskeletal: Neck supple.     Trachea: No tracheal deviation.  Cardiovascular:     Rate and Rhythm: Normal rate and regular rhythm.  Pulmonary:     Effort: Pulmonary effort is normal. No respiratory distress.     Breath sounds: Normal breath sounds. No stridor. No wheezing or rales.  Abdominal:     General: Bowel sounds are normal. There is no distension.     Palpations: Abdomen is soft.     Tenderness: There is no abdominal tenderness. There is no guarding or rebound.  Musculoskeletal:        General: No tenderness.  Skin:    General: Skin is warm and dry.     Findings: No rash.  Neurological:     Mental Status: She is alert.     Cranial Nerves: No cranial nerve deficit (no facial droop, extraocular movements intact, no slurred speech).     Sensory: No sensory deficit.     Motor: No abnormal muscle tone or seizure activity.     Coordination: Coordination normal.      ED Treatments / Results  Labs (all labs ordered are listed, but only abnormal results are displayed) Labs Reviewed  CBC - Abnormal; Notable for the following components:      Result Value   WBC 13.9 (*)    All other components within normal limits  BASIC METABOLIC PANEL - Abnormal; Notable for the following components:   Potassium 3.4 (*)    All other components within normal limits    EKG EKG  Interpretation  Date/Time:  Monday December 01 2018 07:56:20 EST Ventricular Rate:  71 PR Interval:    QRS Duration: 83 QT Interval:  393 QTC Calculation: 428 R Axis:   78 Text Interpretation:  Sinus rhythm Short PR interval No significant change since last tracing Confirmed by Linwood Dibbles [phone]) on 12/01/2018 8:10:17 AM   Radiology Dg Chest 2 View  Result Date: 12/01/2018 CLINICAL DATA:  Chest tightness, shortness of breath, nonproductive cough for the past 5 days. History of asthma, palpitations, former smoker. EXAM: CHEST - 2 VIEW COMPARISON:  PA and lateral chest x-ray of March 17, 2014 FINDINGS: The lungs are well-expanded. The interstitial markings are slightly more conspicuous overall today. There is no alveolar infiltrate or pleural effusion. The heart and pulmonary vascularity are normal. The trachea is midline. The bony thorax exhibits no acute abnormality. IMPRESSION: Mild interstitial prominence bilaterally may reflect exacerbation of asthma or acute bronchitis. No alveolar pneumonia. Electronically Signed   By: David  [place] M.D.   On: 12/01/2018 08:21    Procedures Procedures (including critical care time)  Medications Ordered in ED Medications  albuterol (PROVENTIL) (2.5 MG/3ML) 0.083% nebulizer solution 5 mg (has no administration in time range)     Initial Impression / Assessment and Plan / ED Course  I have reviewed the triage vital signs and the nursing notes.  Pertinent labs & imaging results that were available during my care of the patient were reviewed by me and considered in my medical decision making (see chart for details).  Clinical Course as of Dec 01 904  Mon Dec 01, 2018  6045 Chest x-ray without significant abnormality.  Laboratory tests are normal with exception of an elevated white blood cell count.  This is likely related to her steroids   [JK]    Clinical Course User Index [JK] Linwood Dibbles, MD    Patient presented with shortness of breath.   She is not tachypneic or tachycardic.  She has a normal oxygen saturation.  Lung exam is reassuring.  Chest x-ray does not show any acute abnormalities.  Patient is low risk for pulmonary embolism and is PERC negative.  Symptoms are likely related to her respiratory illness with a cough.  I think she can continue her outpatient management.  Follow-up with her primary doctor.  Final Clinical Impressions(s) / ED Diagnoses   Final diagnoses:  SOB (shortness of breath)    ED Discharge Orders    None       Linwood Dibbles, MD 12/01/18 [phone]

## 2018-12-10 IMAGING — US US PELVIS COMPLETE
1 series · 13 of 25 positions shown · non-contrast
Comparison: CT on 07/28/2011

CLINICAL DATA: Lower abdominal and pelvic pain with nausea and
vomiting beginning today. LMP 04/06/2017.

EXAM:
TRANSABDOMINAL AND TRANSVAGINAL ULTRASOUND OF PELVIS
DOPPLER ULTRASOUND OF OVARIES
TECHNIQUE: Both transabdominal and transvaginal ultrasound examinations of the
pelvis were performed. Transabdominal technique was performed for
global imaging of the pelvis including uterus, ovaries, adnexal
regions, and pelvic cul-de-sac.
It was necessary to proceed with endovaginal exam following the
transabdominal exam to visualize the endometrium and ovaries. Color
and duplex Doppler ultrasound was utilized to evaluate blood flow to
the ovaries.

[Series 1: us pelvis complete · 0.20mm/px · 13 of 89 slices shown]
[im 1/89]
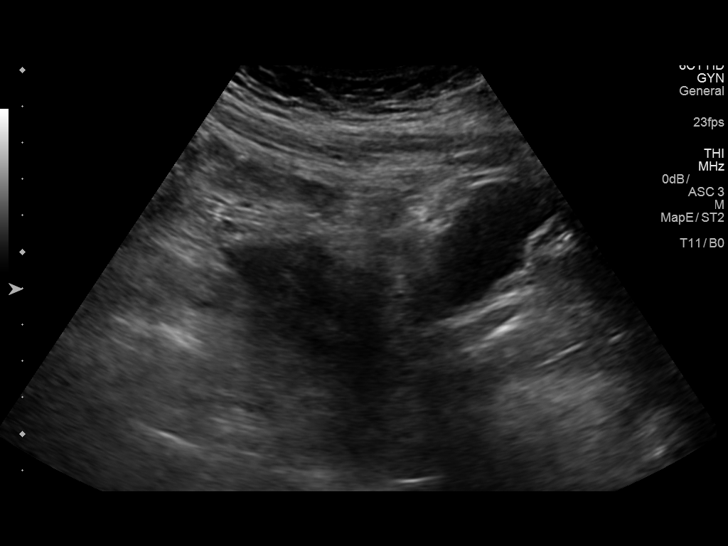
[im 8/89]
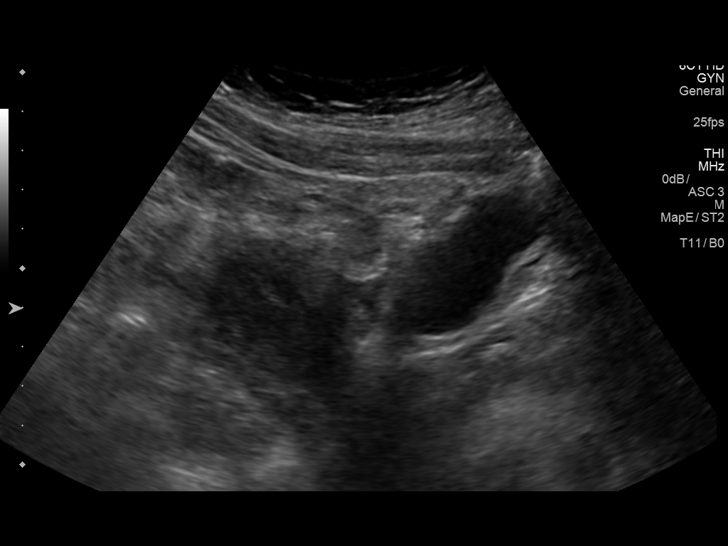
[im 15/89]
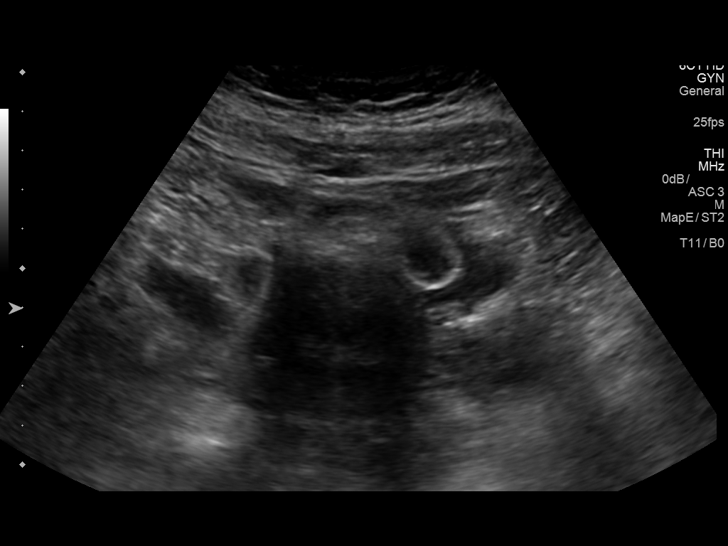
[im 23/89]
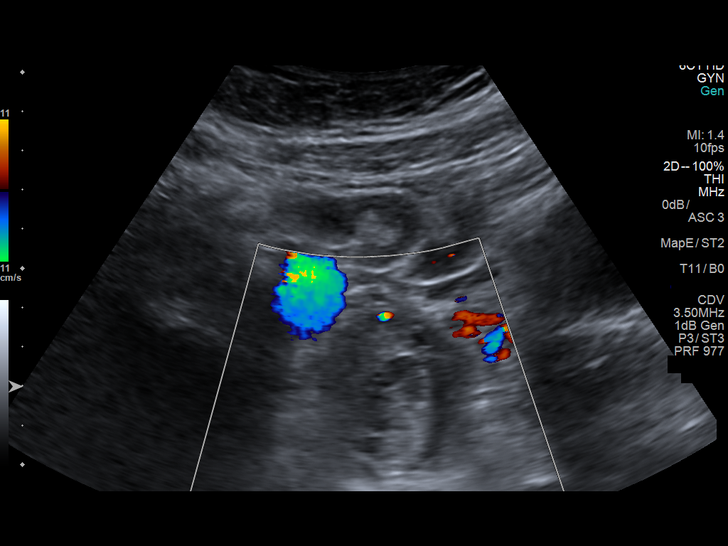
[im 30/89]
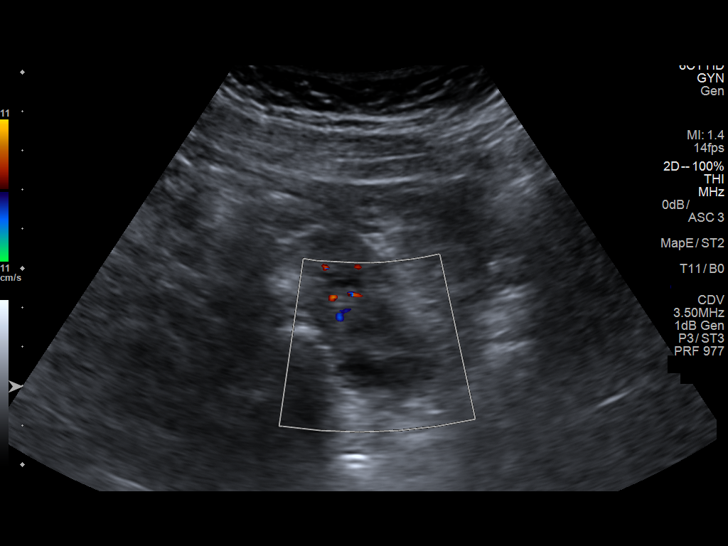
[im 37/89]
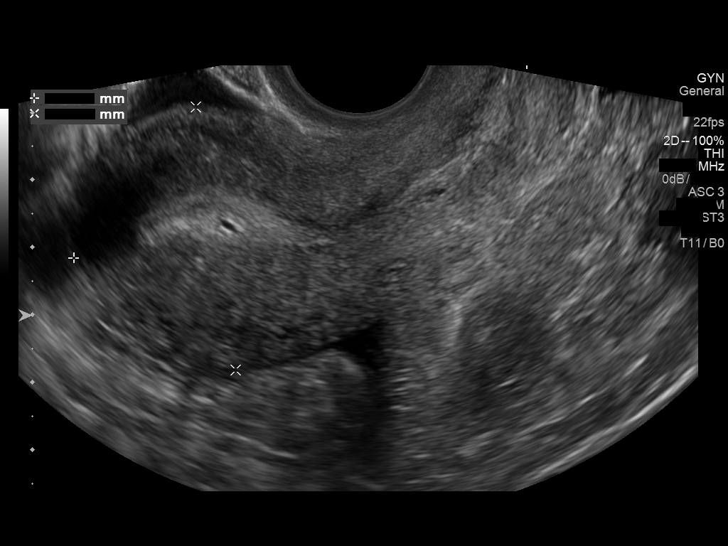
[im 45/89]
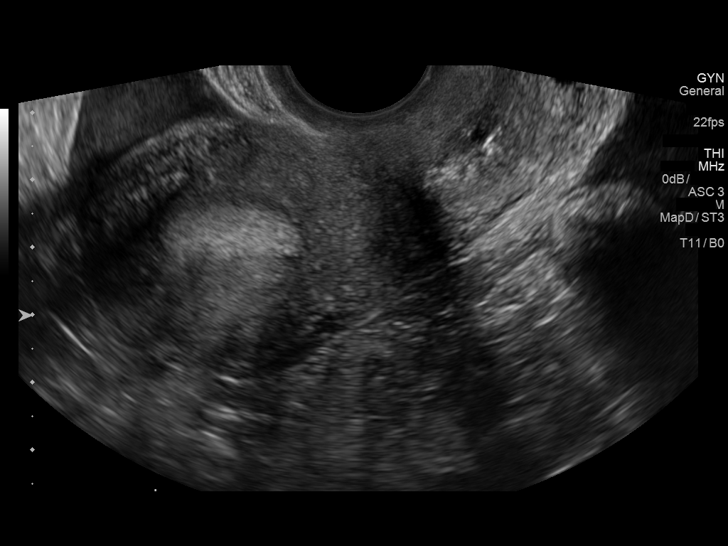
[im 52/89]
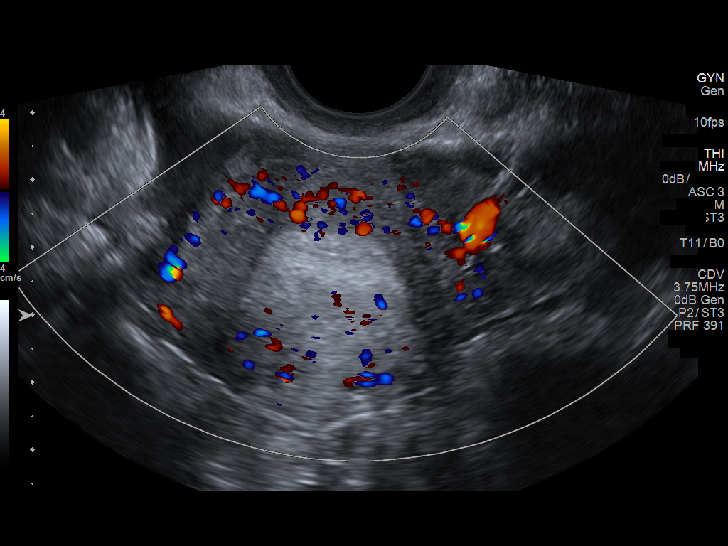
[im 59/89]
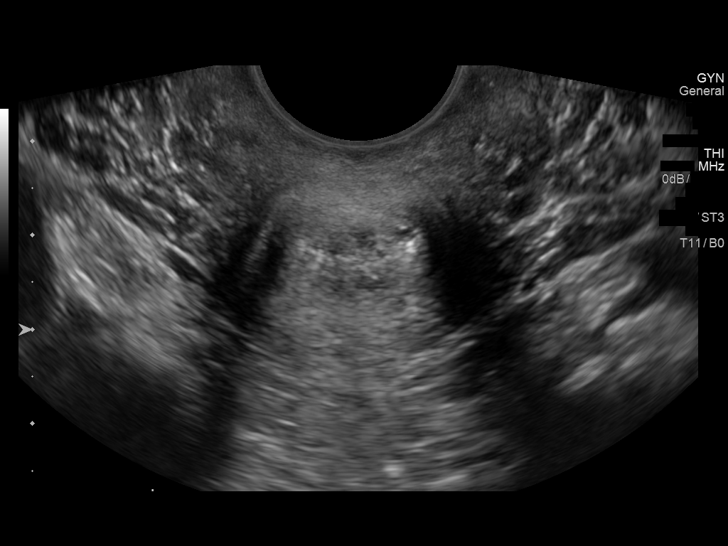
[im 67/89]
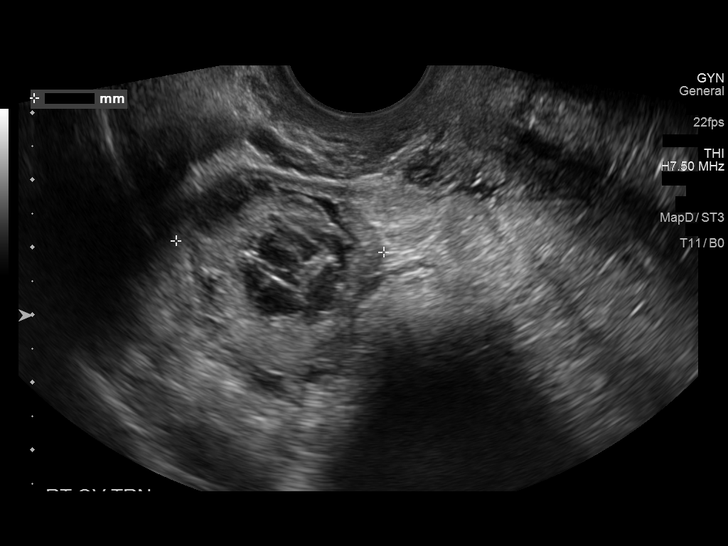
[im 74/89]
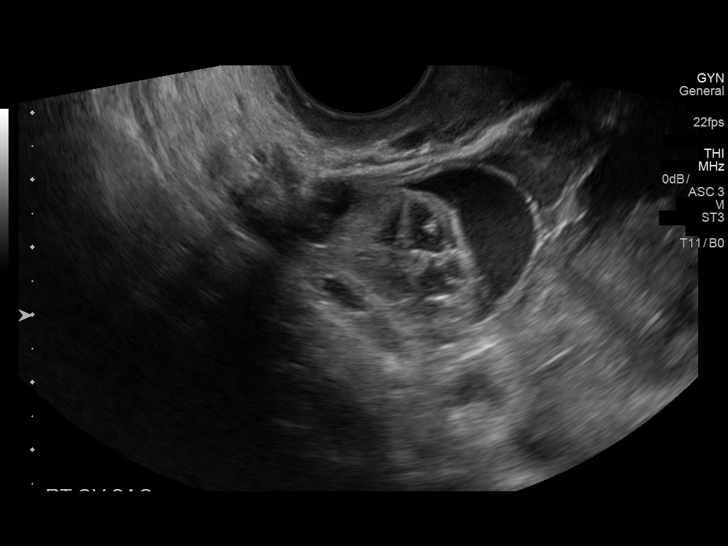
[im 81/89]
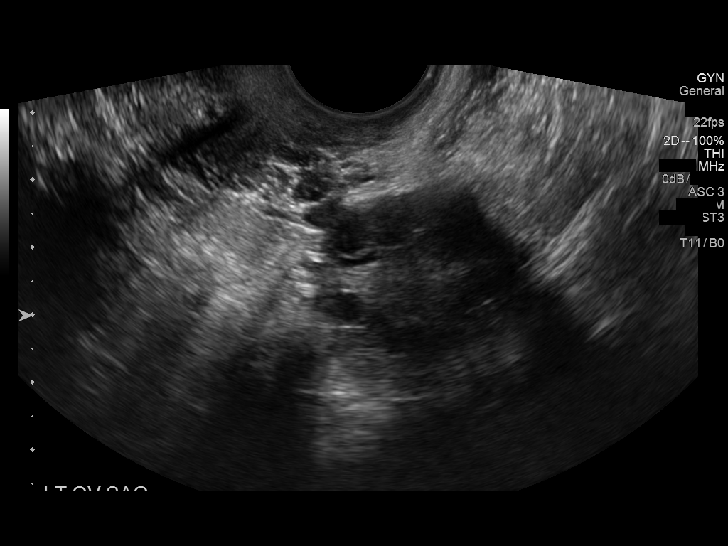
[im 89/89]
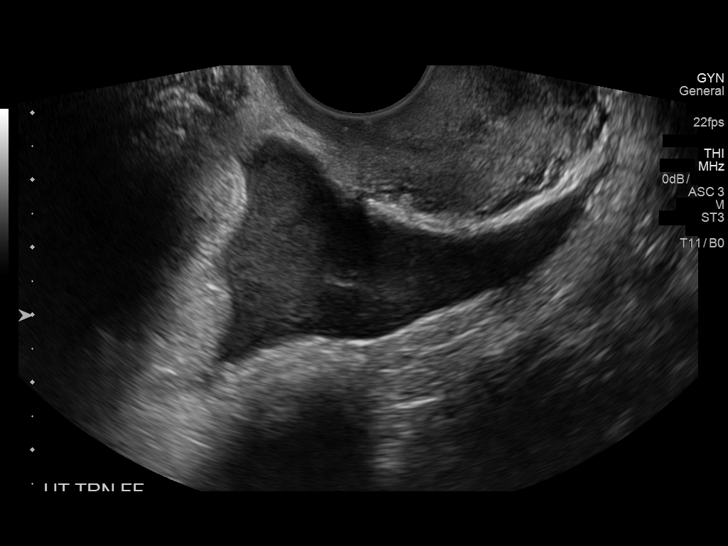

[13 of 25 positions shown; findings below may reference images not displayed]

FINDINGS: Uterus

Measurements: 7.3 x 3.9 x 4.8 cm. No fibroids or other mass
visualized.

Endometrium

Thickness: 7 mm. A single tiny cystic focus is seen in the mid
portion of the endometrium which measures 3 mm. This could represent
a tiny endometrial cyst or intrauterine gestational sac.

Right ovary

Measurements: 4.3 x 3.5 x 3.1 cm. A complex cyst is seen containing
multiple thin septations and internal reticular echo pattern,
without central blood flow on color Doppler ultrasound. This
measures 3.4 x 2.2 by 2.3 cm, and has nonspecific but probably
benign characteristics. This may represent a hemorrhagic cyst.
Peripheral ovarian tissue does show internal blood flow on color
Doppler ultrasound.

Left ovary

Measurements: 3.1 x 2.7 x 3.2 cm. Normal appearance/no adnexal mass.

Pulsed Doppler evaluation of both ovaries demonstrates normal
low-resistance arterial and venous waveforms.

Other findings

Small amount of free fluid noted.
IMPRESSION: 3 mm cystic focus of endometrium, which may represent a tiny
endometrial cyst fourth intrauterine gestational sac. Recommend
correlation with serum pregnancy test.

3.4 cm indeterminate but probably benign cystic lesion in the right
ovary. This may represent a hemorrhagic cyst. Recommend correlation
with serum pregnancy test. Consider short-term followup by
transvaginal pelvic ultrasound in several weeks if pregnancy test is
negative.

Small amount of free pelvic fluid.

No sonographic evidence for ovarian torsion.

## 2019-03-09 ENCOUNTER — Other Ambulatory Visit: Payer: Self-pay

## 2019-03-09 ENCOUNTER — Ambulatory Visit (INDEPENDENT_AMBULATORY_CARE_PROVIDER_SITE_OTHER): Payer: BLUE CROSS/BLUE SHIELD | Admitting: Family Medicine

## 2019-03-09 DIAGNOSIS — J4521 Mild intermittent asthma with (acute) exacerbation: Secondary | ICD-10-CM | POA: Diagnosis not present

## 2019-03-09 MED ORDER — FLUTICASONE PROPIONATE 50 MCG/ACT NA SUSP: 2.0000 | Freq: Every day | NASAL | 1 refills | Status: DC

## 2019-03-09 MED ORDER — PREDNISONE 10 MG PO TABS: ORAL_TABLET | ORAL | 0 refills | Status: DC

## 2019-03-09 NOTE — Progress Notes (Signed)
   Subjective:    Patient ID: Alyssa Mason, female    DOB: [birth year], 33 y.o.   MRN: [id number] Done through video and audio HPI Pt calling today due to allergies. Pt states her allergies act up every Spring. Flared up Friday. Has been using nebulizer and inhaler. Pt is having shortness of breath.   Patient is experiencing flare of her asthma.  Requiring frequent nebulizer treatments. zytec and breathing treatment  Using pump when she needs it   Cough not so bad   Review of Systems No vomiting no diarrhea no rash    Objective:   Physical Exam   Virtual visit no exam     Assessment & Plan:  Impression exacerbation of asthma/exacerbation of allergic rhinitis.  Exacerbation is fairly substantial.  Has required ER visits in the past.  Discussion held.  Short prednisone burst.  Also add Flonase to antihistamine to help with allergic symptoms.  Maintain albuterol as needed.  Warning signs discussed.

## 2019-04-16 ENCOUNTER — Ambulatory Visit (INDEPENDENT_AMBULATORY_CARE_PROVIDER_SITE_OTHER): Payer: BLUE CROSS/BLUE SHIELD | Admitting: Family Medicine

## 2019-04-16 ENCOUNTER — Encounter: Payer: Self-pay | Admitting: Family Medicine

## 2019-04-16 ENCOUNTER — Other Ambulatory Visit: Payer: Self-pay

## 2019-04-16 VITALS — Temp 98.2°F | Ht 61.0 in | Wt 159.2 lb

## 2019-04-16 DIAGNOSIS — M79605 Pain in left leg: Secondary | ICD-10-CM

## 2019-04-16 DIAGNOSIS — R202 Paresthesia of skin: Secondary | ICD-10-CM

## 2019-04-16 NOTE — Progress Notes (Signed)
   Subjective:    Patient ID: Alyssa Mason, female    DOB: 10/14/1986, 33 y.o.   MRN: [id number]  HPI Patient arrives with cramp in her left leg for a few days. She is having a numb feeling also that is affecting her sleep.  Progressive discofor tover the past week  Pain deep in the clf  And notes nmb at times   Also foot feel s cold     Pt woeing abpout nuisanc e aspet of this      No new e  Exercise   No injury     Patient notes left leg discomfort several days duration.  Has not taken any medications.                                                                                                         Review of Systems No headache, no major weight loss or weight gain, no chest pain no back pain abdominal pain no change in bowel habits complete ROS otherwise negative     Objective:   Physical Exam   Alert vitals stable, NAD. Blood pressure good on repeat. HEENT normal. Lungs clear. Heart regular rate and rhythm. Left leg.  Pulses excellent.  Sensation intact.  Knee ankle within normal limits.  No calf pain.  Negative straight leg raise.     Assessment & Plan:  Impression nonspecific left calf discomfort accompanied by sensation of numbness.  Neurovascular exam perfect.  History not particularly illuminating.  At this point would not recommend make a neurology work-up rationale discussed with patient.  Anti-inflammatory medicine PRN for the next 7 to 10 days if persists call us back

## 2019-09-22 ENCOUNTER — Other Ambulatory Visit: Payer: Self-pay

## 2019-09-22 DIAGNOSIS — Z20828 Contact with and (suspected) exposure to other viral communicable diseases: Secondary | ICD-10-CM | POA: Diagnosis not present

## 2019-09-22 DIAGNOSIS — Z20822 Contact with and (suspected) exposure to covid-19: Secondary | ICD-10-CM

## 2019-09-23 LAB — NOVEL CORONAVIRUS, NAA: SARS-CoV-2, NAA: NOT DETECTED

## 2019-10-08 ENCOUNTER — Ambulatory Visit (INDEPENDENT_AMBULATORY_CARE_PROVIDER_SITE_OTHER): Payer: BC Managed Care – PPO | Admitting: Family Medicine

## 2019-10-08 ENCOUNTER — Other Ambulatory Visit: Payer: Self-pay

## 2019-10-08 DIAGNOSIS — J4521 Mild intermittent asthma with (acute) exacerbation: Secondary | ICD-10-CM | POA: Diagnosis not present

## 2019-10-08 MED ORDER — PREDNISONE 20 MG PO TABS: ORAL_TABLET | ORAL | 0 refills | Status: DC

## 2019-10-08 MED ORDER — AMOXICILLIN 500 MG PO CAPS: 500.0000 mg | ORAL_CAPSULE | Freq: Three times a day (TID) | ORAL | 0 refills | Status: DC

## 2019-10-08 NOTE — Progress Notes (Signed)
   Subjective:  Audio only  Patient ID: Alyssa Mason, female    DOB: [birth year], 33 y.o.   MRN: [id number]  Cough This is a new problem. Episode onset: 2 -3 days. Associated symptoms comments: Tightness in chest, sob, dry cough. Treatments tried: neb treatments.   Virtual Visit via Telephone Note  I connected with Norvel Richards on 10/08/19 at  2:00 PM EST by telephone and verified that I am speaking with the correct person using two identifiers.  Location: Patient: home Provider: office   I discussed the limitations, risks, security and privacy concerns of performing an evaluation and management service by telephone and the availability of in person appointments. I also discussed with the patient that there may be a patient responsible charge related to this service. The patient expressed understanding and agreed to proceed.   History of Present Illness:    Observations/Objective:   Assessment and Plan:   Follow Up Instructions:    I discussed the assessment and treatment plan with the patient. The patient was provided an opportunity to ask questions and all were answered. The patient agreed with the plan and demonstrated an understanding of the instructions.   The patient was advised to call back or seek an in-person evaluation if the symptoms worsen or if the condition fails to improve as anticipated.  I provided 69minutes of non-face-to-face time during this encounter.   Dayton Bailiff, LPN Pt had lingering cough  Was exposed to dog over week  Using neb every 4 hrs   No fever  No one   Review of Systems  Respiratory: Positive for cough.        Objective:   Physical Exam  Virtual      Assessment & Plan:  Impression exacerbation of asthma with element of bronchitis/rhinitis.  Recently tested for COVID-19.  All seem to kick in after exposed to a dog.  Settled into the chest.  Patient states it feels very much like her usual asthma flareup.  Warning  signs discussed if symptoms were to spread or worsen would need to consider retesting once again for COVID-19 rationale discussed.

## 2019-10-20 ENCOUNTER — Other Ambulatory Visit: Payer: Self-pay | Admitting: *Deleted

## 2019-10-20 DIAGNOSIS — Z20822 Contact with and (suspected) exposure to covid-19: Secondary | ICD-10-CM

## 2019-10-22 LAB — NOVEL CORONAVIRUS, NAA: SARS-CoV-2, NAA: NOT DETECTED

## 2020-01-04 ENCOUNTER — Encounter: Payer: Self-pay | Admitting: Family Medicine

## 2020-02-03 ENCOUNTER — Ambulatory Visit: Payer: Self-pay | Admitting: Women's Health

## 2020-02-04 ENCOUNTER — Ambulatory Visit: Payer: BC Managed Care – PPO | Admitting: Women's Health

## 2020-02-04 ENCOUNTER — Other Ambulatory Visit (HOSPITAL_COMMUNITY)
Admission: RE | Admit: 2020-02-04 | Discharge: 2020-02-04 | Disposition: A | Discharge status: Home / Self Care | Payer: BC Managed Care – PPO | Source: Ambulatory Visit | Attending: Obstetrics & Gynecology | Admitting: Obstetrics & Gynecology

## 2020-02-04 ENCOUNTER — Other Ambulatory Visit: Payer: Self-pay

## 2020-02-04 VITALS — BP 119/79 | HR 71 | Wt 163.3 lb

## 2020-02-04 DIAGNOSIS — N9089 Other specified noninflammatory disorders of vulva and perineum: Secondary | ICD-10-CM | POA: Diagnosis not present

## 2020-02-04 DIAGNOSIS — Z131 Encounter for screening for diabetes mellitus: Secondary | ICD-10-CM | POA: Diagnosis not present

## 2020-02-04 DIAGNOSIS — R300 Dysuria: Secondary | ICD-10-CM | POA: Diagnosis not present

## 2020-02-04 LAB — POCT URINALYSIS DIPSTICK
Blood, UA: NEGATIVE
Glucose, UA: NEGATIVE
Ketones, UA: NEGATIVE
Leukocytes, UA: NEGATIVE
Nitrite, UA: NEGATIVE
Protein, UA: NEGATIVE

## 2020-02-04 MED ORDER — NYSTATIN-TRIAMCINOLONE 100000-0.1 UNIT/GM-% EX OINT: 1.0000 "application " | TOPICAL_OINTMENT | Freq: Two times a day (BID) | CUTANEOUS | 0 refills | Status: DC

## 2020-02-04 NOTE — Progress Notes (Signed)
   GYN VISIT Patient name: Alyssa Mason MRN [id number]  Date of birth: Oct 22, 1986 Chief Complaint:   burning with urination  History of Present Illness:   Alyssa Mason is a 34 y.o. G29P1001 Caucasian female being seen today for burning w/ urination. Burning is actually when urine hits the skin. Denies abnormal discharge, itching/odor/irritation.  Some urinary frequency but has been drinking a lot more water recently. Some urinary urgency.      No LMP recorded. (Menstrual status: Irregular Periods). The current method of family planning is coitus interruptus.  Last pap 09/19/16. Results were:  normal Review of Systems:   Pertinent items are noted in HPI Denies fever/chills, dizziness, headaches, visual disturbances, fatigue, shortness of breath, chest pain, abdominal pain, vomiting, abnormal vaginal discharge/itching/odor/irritation, problems with periods, bowel movements, urination, or intercourse unless otherwise stated above.  Pertinent History Reviewed:  Reviewed past medical,surgical, social, obstetrical and family history.  Reviewed problem list, medications and allergies. Physical Assessment:   Vitals:   02/04/20 1432  BP: 119/79  Pulse: 71  Weight: 163 lb 4.8 oz (74.1 kg)  Body mass index is 30.86 kg/m.       Physical Examination:   General appearance: alert, well appearing, and in no distress  Mental status: alert, oriented to person, place, and time  Skin: warm & dry   Cardiovascular: normal heart rate noted  Respiratory: normal respiratory effort, no distress  Abdomen: soft, non-tender   Pelvic: VULVA: fissure apex of labia majora, distal labia minora adhered/grown into labia majora. Whitish skin w/ irritation at urethra/clitoris. Pt states she gets fissures frequently  Extremities: no edema   Chaperone: Jobe Marker    Results for orders placed or performed in visit on 02/04/20 (from the past 24 hour(s))  POCT Urinalysis Dipstick   Collection Time: 02/04/20   2:36 PM  Result Value Ref Range   Color, UA     Clarity, UA     Glucose, UA Negative Negative   Bilirubin, UA     Ketones, UA neg    Spec Grav, UA     Blood, UA neg    pH, UA     Protein, UA Negative Negative   Urobilinogen, UA     Nitrite, UA neg    Leukocytes, UA Negative Negative   Appearance     Odor      Assessment & Plan:  1) Vulvar irritation> rx mycolog, send CV swab  2) Frequent vulvar fissures>  And dad just dx w/ DM, will check A1C  3) Dysuria> likely from vulvar irritation, also has frequency/urgency, so will send urine cx  Meds:  Meds ordered this encounter  Medications  . nystatin-triamcinolone ointment (MYCOLOG)    Sig: Apply 1 application topically 2 (two) times daily.    Dispense:  30 g    Refill:  0    Order Specific Question:   Supervising Provider    Answer:   Duane Lope H [[id number]]    Orders Placed This Encounter  Procedures  . Urine Culture  . Hemoglobin A1c  . POCT Urinalysis Dipstick    Return for 1st available, Pap & physical.  Cheral Marker CNM, [facility] 02/04/2020 3:08 PM

## 2020-02-05 LAB — HEMOGLOBIN A1C
Est. average glucose Bld gHb Est-mCnc: 105 mg/dL
Hgb A1c MFr Bld: 5.3 % (ref 4.8–5.6)

## 2020-02-06 LAB — URINE CULTURE

## 2020-02-08 LAB — CERVICOVAGINAL ANCILLARY ONLY
Bacterial Vaginitis (gardnerella): NEGATIVE
Candida Glabrata: NEGATIVE
Candida Vaginitis: POSITIVE — AB
Chlamydia: NEGATIVE
Comment: NEGATIVE
Comment: NEGATIVE
Comment: NEGATIVE
Comment: NEGATIVE
Comment: NEGATIVE
Comment: NORMAL
Neisseria Gonorrhea: NEGATIVE
Trichomonas: NEGATIVE

## 2020-08-02 ENCOUNTER — Ambulatory Visit (INDEPENDENT_AMBULATORY_CARE_PROVIDER_SITE_OTHER): Payer: BC Managed Care – PPO | Admitting: *Deleted

## 2020-08-02 VITALS — BP 135/77 | HR 101

## 2020-08-02 DIAGNOSIS — N926 Irregular menstruation, unspecified: Secondary | ICD-10-CM | POA: Diagnosis not present

## 2020-08-02 LAB — POCT URINE PREGNANCY: Preg Test, Ur: POSITIVE — AB

## 2020-08-02 NOTE — Progress Notes (Signed)
   NURSE VISIT- PREGNANCY CONFIRMATION   SUBJECTIVE:  Alyssa Mason is a 34 y.o. G2P1001 female at Unknown by uncertain LMP of No LMP recorded. (Menstrual status: Irregular Periods). Here for pregnancy confirmation.  Home pregnancy test: positive x 1  She reports no complaints.  She is not taking prenatal vitamins.    OBJECTIVE:  There were no vitals taken for this visit.  Appears well, in no apparent distress OB History  Gravida Para Term Preterm AB Living  2 1 1  0 0 1  SAB TAB Ectopic Multiple Live Births  0 0 0 0 1    # Outcome Date GA Lbr Len/2nd Weight Sex Delivery Anes PTL Lv  2 Gravida           1 Term 05/22/12 [redacted]w[redacted]d 15:01 / 01:58 7 lb 12.7 oz (3.535 kg) F Vag-Vacuum EPI  LIV    No results found for this or any previous visit (from the past 24 hour(s)).  ASSESSMENT: Positive pregnancy test, Unknown by LMP    PLAN: Schedule for dating ultrasound in pending hcg results  Prenatal vitamins: plans to begin OTC ASAP   Nausea medicines: not currently needed   OB packet given: Yes  [redacted]w[redacted]d  08/02/2020 1:33 PM

## 2020-08-02 NOTE — Progress Notes (Signed)
Chart reviewed for nurse visit. Agree with plan of care.  Adline Potter, NP 08/02/2020 1:40 PM

## 2020-08-03 LAB — BETA HCG QUANT (REF LAB): hCG Quant: 2379 m[IU]/mL

## 2020-08-12 NOTE — Telephone Encounter (Signed)
Pt routed original message to KB and advised patient she is not here today. Can a nurse give her a call to review with her what she can take for constipation she used prunes without success.

## 2020-08-19 ENCOUNTER — Other Ambulatory Visit: Payer: Self-pay | Admitting: Obstetrics & Gynecology

## 2020-08-19 DIAGNOSIS — O3680X0 Pregnancy with inconclusive fetal viability, not applicable or unspecified: Secondary | ICD-10-CM

## 2020-08-22 ENCOUNTER — Ambulatory Visit (INDEPENDENT_AMBULATORY_CARE_PROVIDER_SITE_OTHER): Payer: BC Managed Care – PPO

## 2020-08-22 DIAGNOSIS — O3680X0 Pregnancy with inconclusive fetal viability, not applicable or unspecified: Secondary | ICD-10-CM

## 2020-08-22 DIAGNOSIS — Z3A08 8 weeks gestation of pregnancy: Secondary | ICD-10-CM | POA: Diagnosis not present

## 2020-08-22 NOTE — Progress Notes (Signed)
[name] 8 wks,single IUP with YS,FHR 169 bpm,crl 16.30 mm,normal ovaries

## 2020-09-19 ENCOUNTER — Other Ambulatory Visit: Payer: Self-pay | Admitting: Obstetrics & Gynecology

## 2020-09-19 DIAGNOSIS — Z3682 Encounter for antenatal screening for nuchal translucency: Secondary | ICD-10-CM

## 2020-09-20 ENCOUNTER — Ambulatory Visit (INDEPENDENT_AMBULATORY_CARE_PROVIDER_SITE_OTHER): Payer: BC Managed Care – PPO | Admitting: Women's Health

## 2020-09-20 ENCOUNTER — Ambulatory Visit (INDEPENDENT_AMBULATORY_CARE_PROVIDER_SITE_OTHER): Payer: BC Managed Care – PPO

## 2020-09-20 ENCOUNTER — Ambulatory Visit: Payer: BC Managed Care – PPO | Admitting: *Deleted

## 2020-09-20 ENCOUNTER — Encounter: Payer: Self-pay | Admitting: Women's Health

## 2020-09-20 ENCOUNTER — Other Ambulatory Visit (HOSPITAL_COMMUNITY)
Admission: RE | Admit: 2020-09-20 | Discharge: 2020-09-20 | Disposition: A | Discharge status: Home / Self Care | Payer: BC Managed Care – PPO | Source: Ambulatory Visit | Attending: Obstetrics & Gynecology | Admitting: Obstetrics & Gynecology

## 2020-09-20 VITALS — BP 124/75 | HR 83 | Wt 171.0 lb

## 2020-09-20 DIAGNOSIS — Z3490 Encounter for supervision of normal pregnancy, unspecified, unspecified trimester: Secondary | ICD-10-CM | POA: Insufficient documentation

## 2020-09-20 DIAGNOSIS — Z3481 Encounter for supervision of other normal pregnancy, first trimester: Secondary | ICD-10-CM | POA: Diagnosis not present

## 2020-09-20 DIAGNOSIS — Z3A12 12 weeks gestation of pregnancy: Secondary | ICD-10-CM | POA: Diagnosis not present

## 2020-09-20 DIAGNOSIS — Z3682 Encounter for antenatal screening for nuchal translucency: Secondary | ICD-10-CM | POA: Diagnosis not present

## 2020-09-20 DIAGNOSIS — Z3480 Encounter for supervision of other normal pregnancy, unspecified trimester: Secondary | ICD-10-CM

## 2020-09-20 DIAGNOSIS — Z1389 Encounter for screening for other disorder: Secondary | ICD-10-CM

## 2020-09-20 DIAGNOSIS — Z331 Pregnant state, incidental: Secondary | ICD-10-CM

## 2020-09-20 LAB — POCT URINALYSIS DIPSTICK OB
Blood, UA: NEGATIVE
Glucose, UA: NEGATIVE
Ketones, UA: NEGATIVE
Leukocytes, UA: NEGATIVE
Nitrite, UA: NEGATIVE
POC,PROTEIN,UA: NEGATIVE

## 2020-09-20 NOTE — Patient Instructions (Addendum)
Alyssa Mason, I greatly value your feedback.  If you receive a survey following your visit with [name] today, we appreciate you taking the time to fill it out.  Thanks, [name], CNM, WHNP-BC   [facility] ([address] [place], [place] [address]) Entrance C, located off of [address] Free 24/7 valet parking   Nausea & Vomiting  Have saltine crackers or pretzels by your bed and eat a few bites before you raise your head out of bed in the morning  Eat small frequent meals throughout the day instead of large meals  Drink plenty of fluids throughout the day to stay hydrated, just don't drink a lot of fluids with your meals.  This can make your stomach fill up faster making you feel sick  Do not brush your teeth right after you eat  Products with real ginger are good for nausea, like ginger ale and ginger hard candy Make sure it says made with real ginger!  Sucking on sour candy like lemon heads is also good for nausea  If your prenatal vitamins make you nauseated, take them at night so you will sleep through the nausea  Sea Bands  If you feel like you need medicine for the nausea & vomiting please let us know  If you are unable to keep any fluids or food down please let us know   Constipation  Drink plenty of fluid, preferably water, throughout the day  Eat foods high in fiber such as fruits, vegetables, and grains  Exercise, such as walking, is a good way to keep your bowels regular  Drink warm fluids, especially warm prune juice, or decaf coffee  Eat a 1/2 cup of real oatmeal (not instant), 1/2 cup applesauce, and 1/2-1 cup warm prune juice every day  If needed, you may take Colace (docusate sodium) stool softener once or twice a day to help keep the stool soft.   If you still are having problems with constipation, you may take Miralax once daily as needed to help keep your bowels regular.   Meet the Provider Zoom Sessions      [facility] for [facility] is now offering FREE monthly 1-hour virtual Zoom sessions for new, current, and prospective patients.        During these sessions, you can:   Learn about our practice, model of care, services   Get answers to questions about pregnancy and birth during COVID   Pick your provider's brain about anything else!    Sessions will be hosted by [employer] for [employer], Producer, television/film/video, Physicians and Midwives          No registration required      2021 Dates:      All at 6pm     October 21st     November 18th   December 16th     January 20th  February 17th    To join one of these meetings, a few minutes before it is set to start:     Copy/paste the link into your web browser:  [email or web address]    OR  Scan the QR code below (open up your camera and point towards QR code; click on tab that pops up on your phone ("zoom")      Home Blood Pressure Monitoring for Patients   Your provider has recommended that you check your blood pressure (BP) at least once a week at home. If you do  not have a blood pressure cuff at home, one will be provided for you. Contact your provider if you have not received your monitor within 1 week.   Helpful Tips for Accurate Home Blood Pressure Checks  . Don't smoke, exercise, or drink caffeine 30 minutes before checking your BP . Use the restroom before checking your BP (a full bladder can raise your pressure) . Relax in a comfortable upright chair . Feet on the ground . Left arm resting comfortably on a flat surface at the level of your heart . Legs uncrossed . Back supported . Sit quietly and don't talk . Place the cuff on your bare arm . Adjust snuggly, so that only two fingertips can fit between your skin and the top of the cuff . Check 2 readings separated by at least one minute . Keep a log of your BP readings . For a  visual, please reference this diagram: http://ccnc.care/bpdiagram  Provider Name: [facility]     Phone: [phone]  Zone 1: ALL CLEAR  Continue to monitor your symptoms:  . BP reading is less than 140 (top number) or less than 90 (bottom number)  . No right upper stomach pain . No headaches or seeing spots . No feeling nauseated or throwing up . No swelling in face and hands  Zone 2: CAUTION Call your doctor's office for any of the following:  . BP reading is greater than 140 (top number) or greater than 90 (bottom number)  . Stomach pain under your ribs in the middle or right side . Headaches or seeing spots . Feeling nauseated or throwing up . Swelling in face and hands  Zone 3: EMERGENCY  Seek immediate medical care if you have any of the following:  . BP reading is greater than160 (top number) or greater than 110 (bottom number) . Severe headaches not improving with Tylenol . Serious difficulty catching your breath . Any worsening symptoms from Zone 2    First Trimester of Pregnancy The first trimester of pregnancy is from week 1 until the end of week 12 (months 1 through 3). A week after a sperm fertilizes an egg, the egg will implant on the wall of the uterus. This embryo will begin to develop into a baby. Genes from you and your partner are forming the baby. The female genes determine whether the baby is a boy or a girl. At 6-8 weeks, the eyes and face are formed, and the heartbeat can be seen on ultrasound. At the end of 12 weeks, all the baby's organs are formed.  Now that you are pregnant, you will want to do everything you can to have a healthy baby. Two of the most important things are to get good prenatal care and to follow your health care provider's instructions. Prenatal care is all the medical care you receive before the baby's birth. This care will help prevent, find, and treat any problems during the pregnancy and childbirth. BODY CHANGES Your body goes  through many changes during pregnancy. The changes vary from woman to woman.   You may gain or lose a couple of pounds at first.  You may feel sick to your stomach (nauseous) and throw up (vomit). If the vomiting is uncontrollable, call your health care provider.  You may tire easily.  You may develop headaches that can be relieved by medicines approved by your health care provider.  You may urinate more often. Painful urination may mean you have a bladder infection.  You may develop heartburn as a result of your pregnancy.  You may develop constipation because certain hormones are causing the muscles that push waste through your intestines to slow down.  You may develop hemorrhoids or swollen, bulging veins (varicose veins).  Your breasts may begin to grow larger and become tender. Your nipples may stick out more, and the tissue that surrounds them (areola) may become darker.  Your gums may bleed and may be sensitive to brushing and flossing.  Dark spots or blotches (chloasma, mask of pregnancy) may develop on your face. This will likely fade after the baby is born.  Your menstrual periods will stop.  You may have a loss of appetite.  You may develop cravings for certain kinds of food.  You may have changes in your emotions from day to day, such as being excited to be pregnant or being concerned that something may go wrong with the pregnancy and baby.  You may have more vivid and strange dreams.  You may have changes in your hair. These can include thickening of your hair, rapid growth, and changes in texture. Some women also have hair loss during or after pregnancy, or hair that feels dry or thin. Your hair will most likely return to normal after your baby is born. WHAT TO EXPECT AT YOUR PRENATAL VISITS During a routine prenatal visit:  You will be weighed to make sure you and the baby are growing normally.  Your blood pressure will be taken.  Your abdomen will be measured  to track your baby's growth.  The fetal heartbeat will be listened to starting around week 10 or 12 of your pregnancy.  Test results from any previous visits will be discussed. Your health care provider may ask you:  How you are feeling.  If you are feeling the baby move.  If you have had any abnormal symptoms, such as leaking fluid, bleeding, severe headaches, or abdominal cramping.  If you have any questions. Other tests that may be performed during your first trimester include:  Blood tests to find your blood type and to check for the presence of any previous infections. They will also be used to check for low iron levels (anemia) and Rh antibodies. Later in the pregnancy, blood tests for diabetes will be done along with other tests if problems develop.  Urine tests to check for infections, diabetes, or protein in the urine.  An ultrasound to confirm the proper growth and development of the baby.  An amniocentesis to check for possible genetic problems.  Fetal screens for spina bifida and Down syndrome.  You may need other tests to make sure you and the baby are doing well. HOME CARE INSTRUCTIONS  Medicines  Follow your health care provider's instructions regarding medicine use. Specific medicines may be either safe or unsafe to take during pregnancy.  Take your prenatal vitamins as directed.  If you develop constipation, try taking a stool softener if your health care provider approves. Diet  Eat regular, well-balanced meals. Choose a variety of foods, such as meat or vegetable-based protein, fish, milk and low-fat dairy products, vegetables, fruits, and whole grain breads and cereals. Your health care provider will help you determine the amount of weight gain that is right for you.  Avoid raw meat and uncooked cheese. These carry germs that can cause birth defects in the baby.  Eating four or five small meals rather than three large meals a day may help relieve nausea  and vomiting. If you  start to feel nauseous, eating a few soda crackers can be helpful. Drinking liquids between meals instead of during meals also seems to help nausea and vomiting.  If you develop constipation, eat more high-fiber foods, such as fresh vegetables or fruit and whole grains. Drink enough fluids to keep your urine clear or pale yellow. Activity and Exercise  Exercise only as directed by your health care provider. Exercising will help you:  Control your weight.  Stay in shape.  Be prepared for labor and delivery.  Experiencing pain or cramping in the lower abdomen or low back is a good sign that you should stop exercising. Check with your health care provider before continuing normal exercises.  Try to avoid standing for long periods of time. Move your legs often if you must stand in one place for a long time.  Avoid heavy lifting.  Wear low-heeled shoes, and practice good posture.  You may continue to have sex unless your health care provider directs you otherwise. Relief of Pain or Discomfort  Wear a good support bra for breast tenderness.    Take warm sitz baths to soothe any pain or discomfort caused by hemorrhoids. Use hemorrhoid cream if your health care provider approves.    Rest with your legs elevated if you have leg cramps or low back pain.  If you develop varicose veins in your legs, wear support hose. Elevate your feet for 15 minutes, 3-4 times a day. Limit salt in your diet. Prenatal Care  Schedule your prenatal visits by the twelfth week of pregnancy. They are usually scheduled monthly at first, then more often in the last 2 months before delivery.  Write down your questions. Take them to your prenatal visits.  Keep all your prenatal visits as directed by your health care provider. Safety  Wear your seat belt at all times when driving.  Make a list of emergency phone numbers, including numbers for family, friends, the hospital, and police and fire  departments. General Tips  Ask your health care provider for a referral to a local prenatal education class. Begin classes no later than at the beginning of month 6 of your pregnancy.  Ask for help if you have counseling or nutritional needs during pregnancy. Your health care provider can offer advice or refer you to specialists for help with various needs.  Do not use hot tubs, steam rooms, or saunas.  Do not douche or use tampons or scented sanitary pads.  Do not cross your legs for long periods of time.  Avoid cat litter boxes and soil used by cats. These carry germs that can cause birth defects in the baby and possibly loss of the fetus by miscarriage or stillbirth.  Avoid all smoking, herbs, alcohol, and medicines not prescribed by your health care provider. Chemicals in these affect the formation and growth of the baby.  Schedule a dentist appointment. At home, brush your teeth with a soft toothbrush and be gentle when you floss. SEEK MEDICAL CARE IF:   You have dizziness.  You have mild pelvic cramps, pelvic pressure, or nagging pain in the abdominal area.  You have persistent nausea, vomiting, or diarrhea.  You have a bad smelling vaginal discharge.  You have pain with urination.  You notice increased swelling in your face, hands, legs, or ankles. SEEK IMMEDIATE MEDICAL CARE IF:   You have a fever.  You are leaking fluid from your vagina.  You have spotting or bleeding from your vagina.  You have severe  abdominal cramping or pain.  You have rapid weight gain or loss.  You vomit blood or material that looks like coffee grounds.  You are exposed to [place] measles and have never had them.  You are exposed to fifth disease or chickenpox.  You develop a severe headache.  You have shortness of breath.  You have any kind of trauma, such as from a fall or a car accident. Document Released: 11/13/2001 Document Revised: 04/05/2014 Document Reviewed:  09/29/2013 [facility] Patient Information 2015 [employer] [place]. This information is not intended to replace advice given to you by your health care provider. Make sure you discuss any questions you have with your health care provider.

## 2020-09-20 NOTE — Progress Notes (Signed)
[name] 12+1 wks,measurements c/w dates,crl 56.78 mm,fhr 167 bpm,NB present,NT 1.3 mm,anterior placenta,normal ovaries

## 2020-09-20 NOTE — Progress Notes (Signed)
INITIAL OBSTETRICAL VISIT Patient name: Alyssa Mason MRN [id number]  Date of birth: 05/13/1986 Chief Complaint:   Initial Prenatal Visit (nt/it)  History of Present Illness:   Alyssa Mason is a 34 y.o. G43P1011 Caucasian female at [redacted]w[redacted]d by [name] at 8 weeks with an Estimated Date of Delivery: 04/03/21 being seen today for her initial obstetrical visit.   Her obstetrical history is significant for term uncomplicated SVB x 1-conceived on clomid, SAB x 1.   Today she reports some nausea, constipation. Declines meds for nausea. Depression screen [facility] 2/9 09/20/2020 04/10/2018 09/19/2016  Decreased Interest 0 0 0  Down, Depressed, Hopeless 0 0 0  PHQ - 2 Score 0 0 0  Altered sleeping 0 - -  Tired, decreased energy 1 - -  Change in appetite 0 - -  Feeling bad or failure about yourself  0 - -  Trouble concentrating 0 - -  Moving slowly or fidgety/restless 0 - -  Suicidal thoughts 0 - -  PHQ-9 Score 1 - -    No LMP recorded (lmp unknown). Patient is pregnant. Last pap 2017. Results were: normal Review of Systems:   Pertinent items are noted in HPI Denies cramping/contractions, leakage of fluid, vaginal bleeding, abnormal vaginal discharge w/ itching/odor/irritation, headaches, visual changes, shortness of breath, chest pain, abdominal pain, severe nausea/vomiting, or problems with urination or bowel movements unless otherwise stated above.  Pertinent History Reviewed:  Reviewed past medical,surgical, social, obstetrical and family history.  Reviewed problem list, medications and allergies. OB History  Gravida Para Term Preterm AB Living  3 1 1  0 1 1  SAB TAB Ectopic Multiple Live Births  1 0 0 0 1    # Outcome Date GA Lbr Len/2nd Weight Sex Delivery Anes PTL Lv  3 Current           2 SAB 2019          1 Term 05/22/12 [redacted]w[redacted]d 15:01 / 01:58 7 lb 12.7 oz (3.535 kg) F Vag-Vacuum EPI  LIV   Physical Assessment:   Vitals:   09/20/20 1543  BP: 124/75  Pulse: 83  Weight: 171 lb (77.6 kg)   Body mass index is 32.31 kg/m.       Physical Examination:  General appearance - well appearing, and in no distress  Mental status - alert, oriented to person, place, and time  Psych:  She has a normal mood and affect  Skin - warm and dry, normal color, no suspicious lesions noted  Chest - effort normal, all lung fields clear to auscultation bilaterally  Heart - normal rate and regular rhythm  Abdomen - soft, nontender  Extremities:  No swelling or varicosities noted  Pelvic - VULVA: normal appearing vulva with no masses, tenderness or lesions  VAGINA: normal appearing vagina with normal color and discharge, no lesions  CERVIX: normal appearing cervix without discharge or lesions, no CMT  Thin prep pap is done w/ HR HPV cotesting Chaperone: Tish  TODAY'S NT 09/22/20 12+1 wks,measurements c/w dates,crl 56.78 mm,fhr 167 bpm,NB present,NT 1.3 mm,anterior placenta,normal ovaries  Results for orders placed or performed in visit on 09/20/20 (from the past 24 hour(s))  POC Urinalysis Dipstick OB   Collection Time: 09/20/20  3:52 PM  Result Value Ref Range   Color, UA     Clarity, UA     Glucose, UA Negative Negative   Bilirubin, UA     Ketones, UA neg    Spec Grav, UA  Blood, UA neg    pH, UA     POC,PROTEIN,UA Negative Negative, Trace, Small (1+), Moderate (2+), Large (3+), 4+   Urobilinogen, UA     Nitrite, UA neg    Leukocytes, UA Negative Negative   Appearance     Odor      Assessment & Plan:  1) Low-Risk Pregnancy G3P1011 at [redacted]w[redacted]d with an Estimated Date of Delivery: 04/03/21   2) Initial OB visit  3) Nausea> declines meds, gave printed prevention/relief measures   4) Constipation> gave printed prevention/relief measures   Meds: No orders of the defined types were placed in this encounter.   Initial labs obtained Continue prenatal vitamins Reviewed n/v relief measures and warning s/s to report Reviewed recommended weight gain based on pre-gravid BMI Encouraged  well-balanced diet Genetic & carrier screening discussed: requests NT/IT, declines Panorama and Horizon 14  Ultrasound discussed; fetal survey: requested CCNC completed> form faxed if has or is planning to apply for medicaid The nature of [employer] for [employer] with multiple MDs and other Advanced Practice Providers was explained to patient; also emphasized that fellows, residents, and students are part of our team.   Follow-up: Return in about 3 weeks (around 10/11/2020) for LROB, 2nd IT, in person, CNM.   Orders Placed This Encounter  Procedures  . Urine Culture  . Integrated 1  . Pain Management Screening Profile (10S)  . CBC/D/Plt+RPR+Rh+ABO+Rub Ab...  . POC Urinalysis Dipstick OB    Cheral Marker CNM, [facility] 09/20/2020 4:14 PM

## 2020-09-21 LAB — CBC/D/PLT+RPR+RH+ABO+RUB AB...

## 2020-09-22 LAB — CBC/D/PLT+RPR+RH+ABO+RUB AB...
Antibody Screen: NEGATIVE
Basophils Absolute: 0 10*3/uL (ref 0.0–0.2)
Basos: 0 %
EOS (ABSOLUTE): 0.3 10*3/uL (ref 0.0–0.4)
Eos: 2 %
HCV Ab: 0.1 s/co ratio (ref 0.0–0.9)
HIV Screen 4th Generation wRfx: NONREACTIVE
Hematocrit: 36.5 % (ref 34.0–46.6)
Hemoglobin: 11.8 g/dL (ref 11.1–15.9)
Hepatitis B Surface Ag: NEGATIVE
Immature Grans (Abs): 0.1 10*3/uL (ref 0.0–0.1)
Immature Granulocytes: 1 %
Lymphocytes Absolute: 2.7 10*3/uL (ref 0.7–3.1)
Lymphs: 24 %
MCH: 28 pg (ref 26.6–33.0)
MCHC: 32.3 g/dL (ref 31.5–35.7)
MCV: 87 fL (ref 79–97)
Monocytes Absolute: 0.7 10*3/uL (ref 0.1–0.9)
Monocytes: 6 %
Neutrophils Absolute: 7.8 10*3/uL — ABNORMAL HIGH (ref 1.4–7.0)
Neutrophils: 67 %
Platelets: 322 10*3/uL (ref 150–450)
RBC: 4.21 x10E6/uL (ref 3.77–5.28)
RDW: 14.4 % (ref 11.7–15.4)
RPR Ser Ql: NONREACTIVE
Rh Factor: POSITIVE
Rubella Antibodies, IGG: 2.01 index (ref >0.99)
WBC: 11.6 10*3/uL — ABNORMAL HIGH (ref 3.4–10.8)

## 2020-09-22 LAB — PMP SCREEN PROFILE (10S), URINE
Amphetamine Scrn, Ur: NEGATIVE ng/mL
BARBITURATE SCREEN URINE: NEGATIVE ng/mL
BENZODIAZEPINE SCREEN, URINE: NEGATIVE ng/mL
CANNABINOIDS UR QL SCN: NEGATIVE ng/mL
Cocaine (Metab) Scrn, Ur: NEGATIVE ng/mL
Creatinine(Crt), U: 142.3 mg/dL (ref 20.0–300.0)
Methadone Screen, Urine: NEGATIVE ng/mL
OXYCODONE+OXYMORPHONE UR QL SCN: NEGATIVE ng/mL
Opiate Scrn, Ur: NEGATIVE ng/mL
Ph of Urine: 5.9 (ref 4.5–8.9)
Phencyclidine Qn, Ur: NEGATIVE ng/mL
Propoxyphene Scrn, Ur: NEGATIVE ng/mL

## 2020-09-22 LAB — INTEGRATED 1
Crown Rump Length: 56.8 mm
Gest. Age on Collection Date: 12 weeks
Maternal Age at EDD: 34.7 yr
Nuchal Translucency (NT): 1.3 mm
Number of Fetuses: 1
PAPP-A Value: 453.7 ng/mL
Weight: 171 [lb_av]

## 2020-09-22 LAB — CYTOLOGY - PAP
Chlamydia: NEGATIVE
Comment: NEGATIVE
Comment: NEGATIVE
Comment: NORMAL
Diagnosis: NEGATIVE
High risk HPV: NEGATIVE
Neisseria Gonorrhea: NEGATIVE

## 2020-09-22 LAB — HCV INTERPRETATION

## 2020-09-23 LAB — URINE CULTURE

## 2020-10-10 ENCOUNTER — Other Ambulatory Visit: Payer: Self-pay | Admitting: Obstetrics & Gynecology

## 2020-10-11 ENCOUNTER — Encounter: Payer: Self-pay | Admitting: Obstetrics & Gynecology

## 2020-10-11 ENCOUNTER — Ambulatory Visit (INDEPENDENT_AMBULATORY_CARE_PROVIDER_SITE_OTHER): Payer: BC Managed Care – PPO | Admitting: Obstetrics & Gynecology

## 2020-10-11 ENCOUNTER — Other Ambulatory Visit: Payer: BC Managed Care – PPO

## 2020-10-11 VITALS — BP 125/72 | HR 88 | Wt 174.0 lb

## 2020-10-11 DIAGNOSIS — Z331 Pregnant state, incidental: Secondary | ICD-10-CM

## 2020-10-11 DIAGNOSIS — Z1389 Encounter for screening for other disorder: Secondary | ICD-10-CM

## 2020-10-11 DIAGNOSIS — Z3480 Encounter for supervision of other normal pregnancy, unspecified trimester: Secondary | ICD-10-CM

## 2020-10-11 DIAGNOSIS — Z3A15 15 weeks gestation of pregnancy: Secondary | ICD-10-CM

## 2020-10-11 LAB — POCT URINALYSIS DIPSTICK OB
Blood, UA: NEGATIVE
Glucose, UA: NEGATIVE
Ketones, UA: NEGATIVE
Leukocytes, UA: NEGATIVE
Nitrite, UA: NEGATIVE

## 2020-10-11 NOTE — Patient Instructions (Signed)

## 2020-10-11 NOTE — Progress Notes (Signed)
   PRENATAL VISIT NOTE  Subjective:  Alyssa Mason is a 34 y.o. G3P1011 at [redacted]w[redacted]d being seen today for ongoing prenatal care.  She is currently monitored for the following issues for this low-risk pregnancy and has Heart palpitations and Encounter for supervision of normal pregnancy, antepartum on their problem list.  Patient reports no complaints.  Contractions: Not present. Vag. Bleeding: None.   . Denies leaking of fluid.   The following portions of the patient's history were reviewed and updated as appropriate: allergies, current medications, past family history, past medical history, past social history, past surgical history and problem list.   Objective:   Vitals:   10/11/20 1552  BP: 125/72  Pulse: 88  Weight: 174 lb (78.9 kg)    Fetal Status:           General:  Alert, oriented and cooperative. Patient is in no acute distress.  Skin: Skin is warm and dry. No rash noted.   Cardiovascular: Normal heart rate noted  Respiratory: Normal respiratory effort, no problems with respiration noted  Abdomen: Soft, gravid, appropriate for gestational age.  Pain/Pressure: Absent     Pelvic: Cervical exam deferred        Extremities: Normal range of motion.     Mental Status: Normal mood and affect. Normal behavior. Normal judgment and thought content.   Assessment and Plan:  Pregnancy: G3P1011 at [redacted]w[redacted]d 1. Supervision of other normal pregnancy, antepartum  - POC Urinalysis Dipstick OB - US OB DETAIL + 14 WK; Future  2. [redacted] weeks gestation of pregnancy  - POC Urinalysis Dipstick OB  3. Screening for genitourinary condition  - POC Urinalysis Dipstick OB  4. Pregnancy, incidental  - POC Urinalysis Dipstick OB  Preterm labor symptoms and general obstetric precautions including but not limited to vaginal bleeding, contractions, leaking of fluid and fetal movement were reviewed in detail with the patient. Please refer to After Visit Summary for other counseling recommendations.    Return in about 4 weeks (around 11/08/2020).  Future Appointments  Date Time Provider Department Center  11/01/2020  3:00 PM [facility] [name] [facility] None  11/01/2020  4:10 PM Cheral Marker, CNM [facility]    Scheryl Darter, MD

## 2020-10-31 ENCOUNTER — Other Ambulatory Visit: Payer: Self-pay | Admitting: Obstetrics & Gynecology

## 2020-10-31 DIAGNOSIS — Z363 Encounter for antenatal screening for malformations: Secondary | ICD-10-CM

## 2020-10-31 DIAGNOSIS — Z3480 Encounter for supervision of other normal pregnancy, unspecified trimester: Secondary | ICD-10-CM

## 2020-11-01 ENCOUNTER — Other Ambulatory Visit: Payer: Self-pay

## 2020-11-01 ENCOUNTER — Encounter: Payer: Self-pay | Admitting: Women's Health

## 2020-11-01 ENCOUNTER — Ambulatory Visit (INDEPENDENT_AMBULATORY_CARE_PROVIDER_SITE_OTHER): Payer: BC Managed Care – PPO

## 2020-11-01 ENCOUNTER — Ambulatory Visit (INDEPENDENT_AMBULATORY_CARE_PROVIDER_SITE_OTHER): Payer: BC Managed Care – PPO | Admitting: Women's Health

## 2020-11-01 VITALS — BP 132/72 | HR 99 | Wt 178.0 lb

## 2020-11-01 DIAGNOSIS — Z1379 Encounter for other screening for genetic and chromosomal anomalies: Secondary | ICD-10-CM | POA: Diagnosis not present

## 2020-11-01 DIAGNOSIS — Z3480 Encounter for supervision of other normal pregnancy, unspecified trimester: Secondary | ICD-10-CM

## 2020-11-01 DIAGNOSIS — Z1389 Encounter for screening for other disorder: Secondary | ICD-10-CM

## 2020-11-01 DIAGNOSIS — Z3482 Encounter for supervision of other normal pregnancy, second trimester: Secondary | ICD-10-CM

## 2020-11-01 DIAGNOSIS — Z3A18 18 weeks gestation of pregnancy: Secondary | ICD-10-CM

## 2020-11-01 DIAGNOSIS — Z363 Encounter for antenatal screening for malformations: Secondary | ICD-10-CM

## 2020-11-01 DIAGNOSIS — Z331 Pregnant state, incidental: Secondary | ICD-10-CM

## 2020-11-01 NOTE — Patient Instructions (Signed)
Alyssa Mason, I greatly value your feedback.  If you receive a survey following your visit with [name] today, we appreciate you taking the time to fill it out.  Thanks, Alyssa Mason, CNM, WHNP-BC  [facility] ([address] [place], [place] [address]) Entrance C, located off of [address] valet parking  Go to [email or web address] to register for FREE online childbirth classes  [facility]/Family Doctors:  [facility] [phone]            [facility] [phone]                 [facility] [phone] (usually not accepting new patients unless you have family there already, you are always welcome to call and ask)       [facility] [phone]       [facility] Pediatricians/Family Doctors:   [facility]: [phone]  [facility]: [phone]  [facility]: [phone]  [facility] Doctors:   [facility]: [phone]   [facility]: [phone]  [facility] Doctors:  [facility]: [phone]    Home Blood Pressure Monitoring for Patients   Your provider has recommended that you check your blood pressure (BP) at least once a week at home. If you do not have a blood pressure cuff at home, one will be provided for you. Contact your provider if you have not received your monitor within 1 week.   Helpful Tips for Accurate Home Blood Pressure Checks   Don't smoke, exercise, or drink caffeine 30 minutes before checking your BP  Use the restroom before checking your BP (a full bladder can raise your pressure)  Relax in a comfortable upright chair  Feet on the ground  Left arm resting comfortably on a flat surface at the level of your heart  Legs uncrossed  Back supported  Sit quietly and don't talk  Place the cuff on your bare arm  Adjust snuggly, so  that only two fingertips can fit between your skin and the top of the cuff  Check 2 readings separated by at least one minute  Keep a log of your BP readings  For a visual, please reference this diagram: http://ccnc.care/bpdiagram  Provider Name: [facility]     Phone: [phone]  Zone 1: ALL CLEAR  Continue to monitor your symptoms:   BP reading is less than 140 (top number) or less than 90 (bottom number)   No right upper stomach pain  No headaches or seeing spots  No feeling nauseated or throwing up  No swelling in face and hands  Zone 2: CAUTION Call your doctor's office for any of the following:   BP reading is greater than 140 (top number) or greater than 90 (bottom number)   Stomach pain under your ribs in the middle or right side  Headaches or seeing spots  Feeling nauseated or throwing up  Swelling in face and hands  Zone 3: EMERGENCY  Seek immediate medical care if you have any of the following:   BP reading is greater than160 (top number) or greater than 110 (bottom number)  Severe headaches not improving with Tylenol  Serious difficulty catching your breath  Any worsening symptoms from Zone 2     Second Trimester of Pregnancy The second trimester is from week 14 through week 27 (months 4 through 6). The second trimester is often a time when you feel your  best. Your body has adjusted to being pregnant, and you begin to feel better physically. Usually, morning sickness has lessened or quit completely, you may have more energy, and you may have an increase in appetite. The second trimester is also a time when the fetus is growing rapidly. At the end of the sixth month, the fetus is about 9 inches long and weighs about 1 pounds. You will likely begin to feel the baby move (quickening) between 16 and 20 weeks of pregnancy. Body changes during your second trimester Your body continues to go through many changes during your second trimester. The  changes vary from woman to woman.  Your weight will continue to increase. You will notice your lower abdomen bulging out.  You may begin to get stretch marks on your hips, abdomen, and breasts.  You may develop headaches that can be relieved by medicines. The medicines should be approved by your health care provider.  You may urinate more often because the fetus is pressing on your bladder.  You may develop or continue to have heartburn as a result of your pregnancy.  You may develop constipation because certain hormones are causing the muscles that push waste through your intestines to slow down.  You may develop hemorrhoids or swollen, bulging veins (varicose veins).  You may have back pain. This is caused by: ? Weight gain. ? Pregnancy hormones that are relaxing the joints in your pelvis. ? A shift in weight and the muscles that support your balance.  Your breasts will continue to grow and they will continue to become tender.  Your gums may bleed and may be sensitive to brushing and flossing.  Dark spots or blotches (chloasma, mask of pregnancy) may develop on your face. This will likely fade after the baby is born.  A dark line from your belly button to the pubic area (linea nigra) may appear. This will likely fade after the baby is born.  You may have changes in your hair. These can include thickening of your hair, rapid growth, and changes in texture. Some women also have hair loss during or after pregnancy, or hair that feels dry or thin. Your hair will most likely return to normal after your baby is born.  What to expect at prenatal visits During a routine prenatal visit:  You will be weighed to make sure you and the fetus are growing normally.  Your blood pressure will be taken.  Your abdomen will be measured to track your baby's growth.  The fetal heartbeat will be listened to.  Any test results from the previous visit will be discussed.  Your health care  provider may ask you:  How you are feeling.  If you are feeling the baby move.  If you have had any abnormal symptoms, such as leaking fluid, bleeding, severe headaches, or abdominal cramping.  If you are using any tobacco products, including cigarettes, chewing tobacco, and electronic cigarettes.  If you have any questions.  Other tests that may be performed during your second trimester include:  Blood tests that check for: ? Low iron levels (anemia). ? High blood sugar that affects pregnant women (gestational diabetes) between 52 and 28 weeks. ? Rh antibodies. This is to check for a protein on red blood cells (Rh factor).  Urine tests to check for infections, diabetes, or protein in the urine.  An ultrasound to confirm the proper growth and development of the baby.  An amniocentesis to check for possible genetic problems.  Fetal  screens for spina bifida and Down syndrome.  HIV (human immunodeficiency virus) testing. Routine prenatal testing includes screening for HIV, unless you choose not to have this test.  Follow these instructions at home: Medicines  Follow your health care provider's instructions regarding medicine use. Specific medicines may be either safe or unsafe to take during pregnancy.  Take a prenatal vitamin that contains at least 600 micrograms (mcg) of folic acid.  If you develop constipation, try taking a stool softener if your health care provider approves. Eating and drinking  Eat a balanced diet that includes fresh fruits and vegetables, whole grains, good sources of protein such as meat, eggs, or tofu, and low-fat dairy. Your health care provider will help you determine the amount of weight gain that is right for you.  Avoid raw meat and uncooked cheese. These carry germs that can cause birth defects in the baby.  If you have low calcium intake from food, talk to your health care provider about whether you should take a daily calcium  supplement.  Limit foods that are high in fat and processed sugars, such as fried and sweet foods.  To prevent constipation: ? Drink enough fluid to keep your urine clear or pale yellow. ? Eat foods that are high in fiber, such as fresh fruits and vegetables, whole grains, and beans. Activity  Exercise only as directed by your health care provider. Most women can continue their usual exercise routine during pregnancy. Try to exercise for 30 minutes at least 5 days a week. Stop exercising if you experience uterine contractions.  Avoid heavy lifting, wear low heel shoes, and practice good posture.  A sexual relationship may be continued unless your health care provider directs you otherwise. Relieving pain and discomfort  Wear a good support bra to prevent discomfort from breast tenderness.  Take warm sitz baths to soothe any pain or discomfort caused by hemorrhoids. Use hemorrhoid cream if your health care provider approves.  Rest with your legs elevated if you have leg cramps or low back pain.  If you develop varicose veins, wear support hose. Elevate your feet for 15 minutes, 3-4 times a day. Limit salt in your diet. Prenatal Care  Write down your questions. Take them to your prenatal visits.  Keep all your prenatal visits as told by your health care provider. This is important. Safety  Wear your seat belt at all times when driving.  Make a list of emergency phone numbers, including numbers for family, friends, the hospital, and police and fire departments. General instructions  Ask your health care provider for a referral to a local prenatal education class. Begin classes no later than the beginning of month 6 of your pregnancy.  Ask for help if you have counseling or nutritional needs during pregnancy. Your health care provider can offer advice or refer you to specialists for help with various needs.  Do not use hot tubs, steam rooms, or saunas.  Do not douche or use  tampons or scented sanitary pads.  Do not cross your legs for long periods of time.  Avoid cat litter boxes and soil used by cats. These carry germs that can cause birth defects in the baby and possibly loss of the fetus by miscarriage or stillbirth.  Avoid all smoking, herbs, alcohol, and unprescribed drugs. Chemicals in these products can affect the formation and growth of the baby.  Do not use any products that contain nicotine or tobacco, such as cigarettes and e-cigarettes. If you need help  quitting, ask your health care provider.  Visit your dentist if you have not gone yet during your pregnancy. Use a soft toothbrush to brush your teeth and be gentle when you floss. Contact a health care provider if:  You have dizziness.  You have mild pelvic cramps, pelvic pressure, or nagging pain in the abdominal area.  You have persistent nausea, vomiting, or diarrhea.  You have a bad smelling vaginal discharge.  You have pain when you urinate. Get help right away if:  You have a fever.  You are leaking fluid from your vagina.  You have spotting or bleeding from your vagina.  You have severe abdominal cramping or pain.  You have rapid weight gain or weight loss.  You have shortness of breath with chest pain.  You notice sudden or extreme swelling of your face, hands, ankles, feet, or legs.  You have not felt your baby move in over an hour.  You have severe headaches that do not go away when you take medicine.  You have vision changes. Summary  The second trimester is from week 14 through week 27 (months 4 through 6). It is also a time when the fetus is growing rapidly.  Your body goes through many changes during pregnancy. The changes vary from woman to woman.  Avoid all smoking, herbs, alcohol, and unprescribed drugs. These chemicals affect the formation and growth your baby.  Do not use any tobacco products, such as cigarettes, chewing tobacco, and e-cigarettes. If you  need help quitting, ask your health care provider.  Contact your health care provider if you have any questions. Keep all prenatal visits as told by your health care provider. This is important. This information is not intended to replace advice given to you by your health care provider. Make sure you discuss any questions you have with your health care provider. Document Released: 11/13/2001 Document Revised: 04/26/2016 Document Reviewed: 01/20/2013 Elsevier Interactive Patient Education  2017 [employer].

## 2020-11-01 NOTE — Progress Notes (Signed)
   LOW-RISK PREGNANCY VISIT Patient name: Alyssa Mason MRN [id number]  Date of birth: [birth year] Chief Complaint:   Routine Prenatal Visit ([name] today)  History of Present Illness:   Alyssa Mason is a 34 y.o. G46P1011 female at [redacted]w[redacted]d with an Estimated Date of Delivery: 04/03/21 being seen today for ongoing management of a low-risk pregnancy.  Depression screen [facility] 2/9 09/20/2020 04/10/2018 09/19/2016  Decreased Interest 0 0 0  Down, Depressed, Hopeless 0 0 0  PHQ - 2 Score 0 0 0  Altered sleeping 0 - -  Tired, decreased energy 1 - -  Change in appetite 0 - -  Feeling bad or failure about yourself  0 - -  Trouble concentrating 0 - -  Moving slowly or fidgety/restless 0 - -  Suicidal thoughts 0 - -  PHQ-9 Score 1 - -    Today she reports no complaints. Contractions: Not present. Vag. Bleeding: None.  Movement: Present. denies leaking of fluid. Review of Systems:   Pertinent items are noted in HPI Denies abnormal vaginal discharge w/ itching/odor/irritation, headaches, visual changes, shortness of breath, chest pain, abdominal pain, severe nausea/vomiting, or problems with urination or bowel movements unless otherwise stated above. Pertinent History Reviewed:  Reviewed past medical,surgical, social, obstetrical and family history.  Reviewed problem list, medications and allergies. Physical Assessment:   Vitals:   11/01/20 1442  BP: 132/72  Pulse: 99  Weight: 178 lb (80.7 kg)  Body mass index is 33.63 kg/m.        Physical Examination:   General appearance: Well appearing, and in no distress  Mental status: Alert, oriented to person, place, and time  Skin: Warm & dry  Cardiovascular: Normal heart rate noted  Respiratory: Normal respiratory effort, no distress  Abdomen: Soft, gravid, nontender  Pelvic: Cervical exam deferred         Extremities: Edema: None  Fetal Status: Fetal Heart Rate (bpm): 148 u/s   Movement: Present   [name] 18+1 wks,breech,anterior placenta gr 0,fhr 148  bpm,normal ovaries,cx 4.7 cm,svp of fluid 4.9 cm,EFW 247 g 71%,anatomy complete,no obvious abnormalities   Chaperone: N/A   No results found for this or any previous visit (from the past 24 hour(s)).  Assessment & Plan:  1) Low-risk pregnancy G3P1011 at [redacted]w[redacted]d with an Estimated Date of Delivery: 04/03/21    Meds: No orders of the defined types were placed in this encounter.  Labs/procedures today: anatomy u/s, 2nd IT  Plan:  Continue routine obstetrical care  Next visit: prefers in person    Reviewed: Preterm labor symptoms and general obstetric precautions including but not limited to vaginal bleeding, contractions, leaking of fluid and fetal movement were reviewed in detail with the patient.  All questions were answered.   Follow-up: Return in about 4 weeks (around 11/29/2020) for LROB, CNM, in person.  No future appointments.  Orders Placed This Encounter  Procedures  . INTEGRATED 2  . POC Urinalysis Dipstick OB   Cheral Marker CNM, [facility] 11/01/2020 4:19 PM

## 2020-11-01 NOTE — Progress Notes (Signed)
[name] 18+1 wks,breech,anterior placenta gr 0,fhr 148 bpm,normal ovaries,cx 4.7 cm,svp of fluid 4.9 cm,EFW 247 g 71%,anatomy complete,no obvious abnormalities

## 2020-11-03 LAB — INTEGRATED 2
AFP MoM: 1.2
Alpha-Fetoprotein: 45.4 ng/mL
Crown Rump Length: 56.8 mm
DIA MoM: 1.11
DIA Value: 161.7 pg/mL
Estriol, Unconjugated: 1.59 ng/mL
Gest. Age on Collection Date: 12 weeks
Gestational Age: 18 weeks
Maternal Age at EDD: 34.7 yr
Nuchal Translucency (NT): 1.3 mm
Nuchal Translucency MoM: 1.03
Number of Fetuses: 1
PAPP-A MoM: 0.64
PAPP-A Value: 453.7 ng/mL
Test Results:: NEGATIVE
Weight: 171 [lb_av]
Weight: 171 [lb_av]
hCG MoM: 0.56
hCG Value: 13.3 IU/mL
uE3 MoM: 1.14

## 2020-11-29 ENCOUNTER — Other Ambulatory Visit: Payer: Self-pay

## 2020-11-29 ENCOUNTER — Ambulatory Visit (INDEPENDENT_AMBULATORY_CARE_PROVIDER_SITE_OTHER): Payer: BC Managed Care – PPO | Admitting: Women's Health

## 2020-11-29 ENCOUNTER — Encounter: Payer: Self-pay | Admitting: Women's Health

## 2020-11-29 VITALS — BP 116/71 | HR 89 | Wt 180.2 lb

## 2020-11-29 DIAGNOSIS — Z1389 Encounter for screening for other disorder: Secondary | ICD-10-CM

## 2020-11-29 DIAGNOSIS — Z331 Pregnant state, incidental: Secondary | ICD-10-CM

## 2020-11-29 DIAGNOSIS — F5089 Other specified eating disorder: Secondary | ICD-10-CM

## 2020-11-29 DIAGNOSIS — Z3482 Encounter for supervision of other normal pregnancy, second trimester: Secondary | ICD-10-CM

## 2020-11-29 DIAGNOSIS — Z3A22 22 weeks gestation of pregnancy: Secondary | ICD-10-CM

## 2020-11-29 DIAGNOSIS — Z3480 Encounter for supervision of other normal pregnancy, unspecified trimester: Secondary | ICD-10-CM

## 2020-11-29 LAB — POCT URINALYSIS DIPSTICK OB
Blood, UA: NEGATIVE
Glucose, UA: NEGATIVE
Ketones, UA: NEGATIVE
Leukocytes, UA: NEGATIVE
Nitrite, UA: NEGATIVE
POC,PROTEIN,UA: NEGATIVE

## 2020-11-29 NOTE — Progress Notes (Signed)
LOW-RISK PREGNANCY VISIT Patient name: Alyssa Mason MRN [id number]  Date of birth: [birth year] Chief Complaint:   Routine Prenatal Visit  History of Present Illness:   Alyssa Mason is a 34 y.o. G20P1011 female at [redacted]w[redacted]d with an Estimated Date of Delivery: 04/03/21 being seen today for ongoing management of a low-risk pregnancy.  Depression screen [facility] 2/9 09/20/2020 04/10/2018 09/19/2016  Decreased Interest 0 0 0  Down, Depressed, Hopeless 0 0 0  PHQ - 2 Score 0 0 0  Altered sleeping 0 - -  Tired, decreased energy 1 - -  Change in appetite 0 - -  Feeling bad or failure about yourself  0 - -  Trouble concentrating 0 - -  Moving slowly or fidgety/restless 0 - -  Suicidal thoughts 0 - -  PHQ-9 Score 1 - -    Today she reports craving dirt. Not eating it, but wants to. Did the same w/ last pregnancy, but much worse this time. . Contractions: Not present. Vag. Bleeding: None.  Movement: Present. denies leaking of fluid. Review of Systems:   Pertinent items are noted in HPI Denies abnormal vaginal discharge w/ itching/odor/irritation, headaches, visual changes, shortness of breath, chest pain, abdominal pain, severe nausea/vomiting, or problems with urination or bowel movements unless otherwise stated above. Pertinent History Reviewed:  Reviewed past medical,surgical, social, obstetrical and family history.  Reviewed problem list, medications and allergies. Physical Assessment:   Vitals:   11/29/20 1546  BP: 116/71  Pulse: 89  Weight: 180 lb 3.2 oz (81.7 kg)  Body mass index is 34.05 kg/m.        Physical Examination:   General appearance: Well appearing, and in no distress  Mental status: Alert, oriented to person, place, and time  Skin: Warm & dry  Cardiovascular: Normal heart rate noted  Respiratory: Normal respiratory effort, no distress  Abdomen: Soft, gravid, nontender  Pelvic: Cervical exam deferred         Extremities: Edema: Trace  Fetal Status: Fetal Heart Rate (bpm):  148 Fundal Height: 24 cm Movement: Present    Chaperone: N/A   Results for orders placed or performed in visit on 11/29/20 (from the past 24 hour(s))  POC Urinalysis Dipstick OB   Collection Time: 11/29/20  3:46 PM  Result Value Ref Range   Color, UA     Clarity, UA     Glucose, UA Negative Negative   Bilirubin, UA     Ketones, UA neg    Spec Grav, UA     Blood, UA neg    pH, UA     POC,PROTEIN,UA Negative Negative, Trace, Small (1+), Moderate (2+), Large (3+), 4+   Urobilinogen, UA     Nitrite, UA neg    Leukocytes, UA Negative Negative   Appearance     Odor      Assessment & Plan:  1) Low-risk pregnancy G3P1011 at [redacted]w[redacted]d with an Estimated Date of Delivery: 04/03/21   2) PICA, craving dirt. Check CBC today   Meds: No orders of the defined types were placed in this encounter.  Labs/procedures today: cbc  Plan:  Continue routine obstetrical care  Next visit: prefers will be in person for pn2    Reviewed: Preterm labor symptoms and general obstetric precautions including but not limited to vaginal bleeding, contractions, leaking of fluid and fetal movement were reviewed in detail with the patient.  All questions were answered.   Follow-up: Return in about 4 weeks (around 12/27/2020) for LROB, PN2, in  person, CNM.  No future appointments.  Orders Placed This Encounter  Procedures  . CBC  . POC Urinalysis Dipstick OB   Cheral Marker CNM, [facility] 11/29/2020 4:19 PM

## 2020-11-29 NOTE — Patient Instructions (Addendum)
Alyssa Mason, I greatly value your feedback.  If you receive a survey following your visit with [name] today, we appreciate you taking the time to fill it out.  Thanks, [name], CNM, WHNP-BC   You will have your sugar test next visit.  Please do not eat or drink anything after midnight the night before you come, not even water.  You will be here for at least two hours.  Please make an appointment online for the bloodwork at [email or web address] for 8:30am (or as close to this as possible). Make sure you select the [facility] service center. The day of the appointment, check in with our office first, then you will go to [facility] to start the sugar test.    [facility] [place], [place] [address]) Entrance C, located off of [address] valet parking   If hemoglobin less than 10.5, take ferrous sulfate 325mg  every other day with orange juice  Go to [email or web address] to register for FREE online childbirth classes   Call the office [phone]) or go to [facility] if:  You begin to have strong, frequent contractions  Your water breaks.  Sometimes it is a big gush of fluid, sometimes it is just a trickle that keeps getting your panties wet or running down your legs  You have vaginal bleeding.  It is normal to have a small amount of spotting if your cervix was checked.   You don't feel your baby moving like normal.  If you don't, get you something to eat and drink and lay down and focus on feeling your baby move.   If your baby is still not moving like normal, you should call the office or go to [facility].  [place] Pediatricians/Family Doctors:  [facility] [phone]            [facility] [phone]                 [facility] [phone] (usually not accepting new patients unless you have family there already, you are always welcome to call and ask)       [facility]  Department [phone]       [facility] Pediatricians/Family Doctors:   [facility]: [phone]  [facility]: [phone]  [facility]: [phone]  [facility] Doctors:   [facility]: [phone]   [phone] Family Medicine: [phone]  [facility] Doctors:  [facility]: [phone]   Home Blood Pressure Monitoring for Patients   Your provider has recommended that you check your blood pressure (BP) at least once a week at home. If you do not have a blood pressure cuff at home, one will be provided for you. Contact your provider if you have not received your monitor within 1 week.   Helpful Tips for Accurate Home Blood Pressure Checks  . Don't smoke, exercise, or drink caffeine 30 minutes before checking your BP . Use the restroom before checking your BP (a full bladder can raise your pressure) . Relax in a comfortable upright chair . Feet on the ground . Left arm resting comfortably on a flat surface at the level of your heart . Legs uncrossed . Back supported . Sit quietly and don't talk . Place the cuff on your bare arm . Adjust snuggly, so that only two fingertips can fit between your skin and the top of the cuff . Check 2 readings separated by  at least one minute . Keep a log of your BP readings . For a visual, please reference this diagram: http://ccnc.care/bpdiagram  Provider Name: [facility]     Phone: [phone]  Zone 1: ALL CLEAR  Continue to monitor your symptoms:  . BP reading is less than 140 (top number) or less than 90 (bottom number)  . No right upper stomach pain . No headaches or seeing spots . No feeling nauseated or throwing up . No swelling in face and hands  Zone 2: CAUTION Call your doctor's office for any of the following:  . BP reading is greater than 140 (top number) or greater than 90 (bottom number)  . Stomach pain under your ribs in the  middle or right side . Headaches or seeing spots . Feeling nauseated or throwing up . Swelling in face and hands  Zone 3: EMERGENCY  Seek immediate medical care if you have any of the following:  . BP reading is greater than160 (top number) or greater than 110 (bottom number) . Severe headaches not improving with Tylenol . Serious difficulty catching your breath . Any worsening symptoms from Zone 2   Second Trimester of Pregnancy The second trimester is from week 13 through week 28, months 4 through 6. The second trimester is often a time when you feel your best. Your body has also adjusted to being pregnant, and you begin to feel better physically. Usually, morning sickness has lessened or quit completely, you may have more energy, and you may have an increase in appetite. The second trimester is also a time when the fetus is growing rapidly. At the end of the sixth month, the fetus is about 9 inches long and weighs about 1 pounds. You will likely begin to feel the baby move (quickening) between 18 and 20 weeks of the pregnancy. BODY CHANGES Your body goes through many changes during pregnancy. The changes vary from woman to woman.   Your weight will continue to increase. You will notice your lower abdomen bulging out.  You may begin to get stretch marks on your hips, abdomen, and breasts.  You may develop headaches that can be relieved by medicines approved by your health care provider.  You may urinate more often because the fetus is pressing on your bladder.  You may develop or continue to have heartburn as a result of your pregnancy.  You may develop constipation because certain hormones are causing the muscles that push waste through your intestines to slow down.  You may develop hemorrhoids or swollen, bulging veins (varicose veins).  You may have back pain because of the weight gain and pregnancy hormones relaxing your joints between the bones in your pelvis and as a result of  a shift in weight and the muscles that support your balance.  Your breasts will continue to grow and be tender.  Your gums may bleed and may be sensitive to brushing and flossing.  Dark spots or blotches (chloasma, mask of pregnancy) may develop on your face. This will likely fade after the baby is born.  A dark line from your belly button to the pubic area (linea nigra) may appear. This will likely fade after the baby is born.  You may have changes in your hair. These can include thickening of your hair, rapid growth, and changes in texture. Some women also have hair loss during or after pregnancy, or hair that feels dry or thin. Your hair will most likely return to normal after your  baby is born. WHAT TO EXPECT AT YOUR PRENATAL VISITS During a routine prenatal visit:  You will be weighed to make sure you and the fetus are growing normally.  Your blood pressure will be taken.  Your abdomen will be measured to track your baby's growth.  The fetal heartbeat will be listened to.  Any test results from the previous visit will be discussed. Your health care provider may ask you:  How you are feeling.  If you are feeling the baby move.  If you have had any abnormal symptoms, such as leaking fluid, bleeding, severe headaches, or abdominal cramping.  If you have any questions. Other tests that may be performed during your second trimester include:  Blood tests that check for:  Low iron levels (anemia).  Gestational diabetes (between 24 and 28 weeks).  Rh antibodies.  Urine tests to check for infections, diabetes, or protein in the urine.  An ultrasound to confirm the proper growth and development of the baby.  An amniocentesis to check for possible genetic problems.  Fetal screens for spina bifida and Down syndrome. HOME CARE INSTRUCTIONS   Avoid all smoking, herbs, alcohol, and unprescribed drugs. These chemicals affect the formation and growth of the baby.  Follow your  health care provider's instructions regarding medicine use. There are medicines that are either safe or unsafe to take during pregnancy.  Exercise only as directed by your health care provider. Experiencing uterine cramps is a good sign to stop exercising.  Continue to eat regular, healthy meals.  Wear a good support bra for breast tenderness.  Do not use hot tubs, steam rooms, or saunas.  Wear your seat belt at all times when driving.  Avoid raw meat, uncooked cheese, cat litter boxes, and soil used by cats. These carry germs that can cause birth defects in the baby.  Take your prenatal vitamins.  Try taking a stool softener (if your health care provider approves) if you develop constipation. Eat more high-fiber foods, such as fresh vegetables or fruit and whole grains. Drink plenty of fluids to keep your urine clear or pale yellow.  Take warm sitz baths to soothe any pain or discomfort caused by hemorrhoids. Use hemorrhoid cream if your health care provider approves.  If you develop varicose veins, wear support hose. Elevate your feet for 15 minutes, 3-4 times a day. Limit salt in your diet.  Avoid heavy lifting, wear low heel shoes, and practice good posture.  Rest with your legs elevated if you have leg cramps or low back pain.  Visit your dentist if you have not gone yet during your pregnancy. Use a soft toothbrush to brush your teeth and be gentle when you floss.  A sexual relationship may be continued unless your health care provider directs you otherwise.  Continue to go to all your prenatal visits as directed by your health care provider. SEEK MEDICAL CARE IF:   You have dizziness.  You have mild pelvic cramps, pelvic pressure, or nagging pain in the abdominal area.  You have persistent nausea, vomiting, or diarrhea.  You have a bad smelling vaginal discharge.  You have pain with urination. SEEK IMMEDIATE MEDICAL CARE IF:   You have a fever.  You are leaking  fluid from your vagina.  You have spotting or bleeding from your vagina.  You have severe abdominal cramping or pain.  You have rapid weight gain or loss.  You have shortness of breath with chest pain.  You notice sudden or extreme  swelling of your face, hands, ankles, feet, or legs.  You have not felt your baby move in over an hour.  You have severe headaches that do not go away with medicine.  You have vision changes. Document Released: 11/13/2001 Document Revised: 11/24/2013 Document Reviewed: 01/20/2013 [facility] Patient Information 2015 [place], [place]. This information is not intended to replace advice given to you by your health care provider. Make sure you discuss any questions you have with your health care provider.

## 2020-11-30 LAB — CBC
Hematocrit: 31.7 % — ABNORMAL LOW (ref 34.0–46.6)
Hemoglobin: 10.7 g/dL — ABNORMAL LOW (ref 11.1–15.9)
MCH: 28 pg (ref 26.6–33.0)
MCHC: 33.8 g/dL (ref 31.5–35.7)
MCV: 83 fL (ref 79–97)
Platelets: 311 10*3/uL (ref 150–450)
RBC: 3.82 x10E6/uL (ref 3.77–5.28)
RDW: 14.1 % (ref 11.7–15.4)
WBC: 15.9 10*3/uL — ABNORMAL HIGH (ref 3.4–10.8)

## 2020-12-03 NOTE — L&D Delivery Note (Signed)
Vacuum Assisted Vaginal Delivery Called to bedside by Dr. Macon Large and Misty Stanley, CNM for assistance of VAVD. Indication for operative vaginal delivery: arrest of descent and NRFHT. History of VAVD with prior gestation.  Patient was examined and found to be fully dilated with fetal station of +2.  Patient's bladder was noted to be empty, and there were no known fetal contraindications to operative vaginal delivery.  FHR tracing remarkable for recurrent variable decels with contractions.  Risks of vacuum assistance were discussed in detail, including but not limited to, bleeding, infection, damage to maternal tissues, fetal cephalohematoma, inability to effect vaginal delivery of the head or shoulder dystocia that cannot be resolved by established maneuvers and need for emergency cesarean section.  Patient gave verbal consent.  The soft vacuum cup was positioned over the sagittal suture 3 cm anterior to posterior fontanelle.  Pressure was then increased to 500 mmHg, and the patient was instructed to push.  Pulling was administered along the pelvic curve while patient was pushing; there were 3 contractions and 0 popoffs.  Vacuum was reduced in between contractions.  The infant was then delivered atraumatically, noted to be a viable female infant, Apgars of 7 and 9.  Infant delivered LOA, tight nuchal x1, delivered through, left compound hand. Neonatology present for delivery.  There was spontaneous placental delivery, intact with three-vessel cord.  Intact perineum, with bilateral labial superficial lacerations, hemostatic, not repaired. EBL 15mL, epidural anesthesia.  Sponge, instrument and needle counts were correct x 2.  The patient and baby were stable after delivery and remained in couplet care, with plans to transfer later to postpartum unit.  Alric Seton, MD OB Fellow, Faculty [facility] 03/31/2021 9:26 AM

## 2020-12-16 ENCOUNTER — Other Ambulatory Visit: Payer: Self-pay

## 2020-12-16 ENCOUNTER — Encounter: Payer: BC Managed Care – PPO | Admitting: Family Medicine

## 2020-12-27 ENCOUNTER — Other Ambulatory Visit: Payer: Self-pay

## 2020-12-27 ENCOUNTER — Other Ambulatory Visit: Payer: BC Managed Care – PPO

## 2020-12-27 ENCOUNTER — Encounter: Payer: Self-pay | Admitting: Women's Health

## 2020-12-27 ENCOUNTER — Ambulatory Visit (INDEPENDENT_AMBULATORY_CARE_PROVIDER_SITE_OTHER): Payer: BC Managed Care – PPO | Admitting: Women's Health

## 2020-12-27 VITALS — BP 122/79 | HR 96 | Wt 189.0 lb

## 2020-12-27 DIAGNOSIS — Z131 Encounter for screening for diabetes mellitus: Secondary | ICD-10-CM

## 2020-12-27 DIAGNOSIS — Z3480 Encounter for supervision of other normal pregnancy, unspecified trimester: Secondary | ICD-10-CM

## 2020-12-27 DIAGNOSIS — Z23 Encounter for immunization: Secondary | ICD-10-CM | POA: Diagnosis not present

## 2020-12-27 DIAGNOSIS — Z3A26 26 weeks gestation of pregnancy: Secondary | ICD-10-CM

## 2020-12-27 DIAGNOSIS — Z3482 Encounter for supervision of other normal pregnancy, second trimester: Secondary | ICD-10-CM | POA: Diagnosis not present

## 2020-12-27 NOTE — Patient Instructions (Signed)
Alyssa Mason, I greatly value your feedback.  If you receive a survey following your visit with [name] today, we appreciate you taking the time to fill it out.  Thanks, [name], CNM, WHNP-BC   [facility] ([address] [place], [place] [address]) Entrance C, located off of [address] valet parking  Go to [email or web address] to register for FREE online childbirth classes   Call the office [phone]) or go to [facility] if:  You begin to have strong, frequent contractions  Your water breaks.  Sometimes it is a big gush of fluid, sometimes it is just a trickle that keeps getting your panties wet or running down your legs  You have vaginal bleeding.  It is normal to have a small amount of spotting if your cervix was checked.   You don't feel your baby moving like normal.  If you don't, get you something to eat and drink and lay down and focus on feeling your baby move.  You should feel at least 10 movements in 2 hours.  If you don't, you should call the office or go to [facility].    Tdap Vaccine  It is recommended that you get the Tdap vaccine during the third trimester of EACH pregnancy to help protect your baby from getting pertussis (whooping cough)  27-36 weeks is the BEST time to do this so that you can pass the protection on to your baby. During pregnancy is better than after pregnancy, but if you are unable to get it during pregnancy it will be offered at the hospital.   You can get this vaccine with [name], at the health department, your family doctor, or some local pharmacies  Everyone who will be around your baby should also be up-to-date on their vaccines before the baby comes. Adults (who are not pregnant) only need 1 dose of Tdap during adulthood.   [place] Pediatricians/Family Doctors:  [facility] [phone]            [facility] [phone]                 [facility]  [phone] (usually not accepting new patients unless you have family there already, you are always welcome to call and ask)       [facility] [phone]       [facility] - Inpatient Pediatricians/Family Doctors:   [facility]: [phone]  [facility]: [phone]  [facility]: [phone]  [facility] Doctors:   [facility]: [phone]   [facility]: [phone]  [facility] Doctors:  [facility]: [phone]   Home Blood Pressure Monitoring for Patients   Your provider has recommended that you check your blood pressure (BP) at least once a week at home. If you do not have a blood pressure cuff at home, one will be provided for you. Contact your provider if you have not received your monitor within 1 week.   Helpful Tips for Accurate Home Blood Pressure Checks  . Don't smoke, exercise, or drink caffeine 30 minutes before checking your BP . Use the restroom before checking your BP (a full bladder can raise your pressure) . Relax in a comfortable upright chair . Feet on the ground . Left arm resting comfortably on a flat surface at the level of your heart . Legs uncrossed . Back supported . Sit quietly and don't talk . Place the cuff on your  bare arm . Adjust snuggly, so that only two fingertips can fit between your skin and the top of the cuff . Check 2 readings separated by at least one minute . Keep a log of your BP readings . For a visual, please reference this diagram: http://ccnc.care/bpdiagram  Provider Name: [facility]     Phone: [phone]  Zone 1: ALL CLEAR  Continue to monitor your symptoms:  . BP reading is less than 140 (top number) or less than 90 (bottom number)  . No right upper stomach pain . No headaches or seeing spots . No feeling nauseated or throwing up . No swelling in face and hands  Zone 2: CAUTION Call your  doctor's office for any of the following:  . BP reading is greater than 140 (top number) or greater than 90 (bottom number)  . Stomach pain under your ribs in the middle or right side . Headaches or seeing spots . Feeling nauseated or throwing up . Swelling in face and hands  Zone 3: EMERGENCY  Seek immediate medical care if you have any of the following:  . BP reading is greater than160 (top number) or greater than 110 (bottom number) . Severe headaches not improving with Tylenol . Serious difficulty catching your breath . Any worsening symptoms from Zone 2   Third Trimester of Pregnancy The third trimester is from week 29 through week 42, months 7 through 9. The third trimester is a time when the fetus is growing rapidly. At the end of the ninth month, the fetus is about 20 inches in length and weighs 6-10 pounds.  BODY CHANGES Your body goes through many changes during pregnancy. The changes vary from woman to woman.   Your weight will continue to increase. You can expect to gain 25-35 pounds (11-16 kg) by the end of the pregnancy.  You may begin to get stretch marks on your hips, abdomen, and breasts.  You may urinate more often because the fetus is moving lower into your pelvis and pressing on your bladder.  You may develop or continue to have heartburn as a result of your pregnancy.  You may develop constipation because certain hormones are causing the muscles that push waste through your intestines to slow down.  You may develop hemorrhoids or swollen, bulging veins (varicose veins).  You may have pelvic pain because of the weight gain and pregnancy hormones relaxing your joints between the bones in your pelvis. Backaches may result from overexertion of the muscles supporting your posture.  You may have changes in your hair. These can include thickening of your hair, rapid growth, and changes in texture. Some women also have hair loss during or after pregnancy, or hair that  feels dry or thin. Your hair will most likely return to normal after your baby is born.  Your breasts will continue to grow and be tender. A yellow discharge may leak from your breasts called colostrum.  Your belly button may stick out.  You may feel short of breath because of your expanding uterus.  You may notice the fetus "dropping," or moving lower in your abdomen.  You may have a bloody mucus discharge. This usually occurs a few days to a week before labor begins.  Your cervix becomes thin and soft (effaced) near your due date. WHAT TO EXPECT AT YOUR PRENATAL EXAMS  You will have prenatal exams every 2 weeks until week 36. Then, you will have weekly prenatal exams. During a routine prenatal visit:  You will be weighed to make sure you and the fetus are growing normally.  Your blood pressure is taken.  Your abdomen will be measured to track your baby's growth.  The fetal heartbeat will be listened to.  Any test results from the previous visit will be discussed.  You may have a cervical check near your due date to see if you have effaced. At around 36 weeks, your caregiver will check your cervix. At the same time, your caregiver will also perform a test on the secretions of the vaginal tissue. This test is to determine if a type of bacteria, Group B streptococcus, is present. Your caregiver will explain this further. Your caregiver may ask you:  What your birth plan is.  How you are feeling.  If you are feeling the baby move.  If you have had any abnormal symptoms, such as leaking fluid, bleeding, severe headaches, or abdominal cramping.  If you have any questions. Other tests or screenings that may be performed during your third trimester include:  Blood tests that check for low iron levels (anemia).  Fetal testing to check the health, activity level, and growth of the fetus. Testing is done if you have certain medical conditions or if there are problems during the  pregnancy. FALSE LABOR You may feel small, irregular contractions that eventually go away. These are called Braxton Hicks contractions, or false labor. Contractions may last for hours, days, or even weeks before true labor sets in. If contractions come at regular intervals, intensify, or become painful, it is best to be seen by your caregiver.  SIGNS OF LABOR   Menstrual-like cramps.  Contractions that are 5 minutes apart or less.  Contractions that start on the top of the uterus and spread down to the lower abdomen and back.  A sense of increased pelvic pressure or back pain.  A watery or bloody mucus discharge that comes from the vagina. If you have any of these signs before the 37th week of pregnancy, call your caregiver right away. You need to go to the hospital to get checked immediately. HOME CARE INSTRUCTIONS   Avoid all smoking, herbs, alcohol, and unprescribed drugs. These chemicals affect the formation and growth of the baby.  Follow your caregiver's instructions regarding medicine use. There are medicines that are either safe or unsafe to take during pregnancy.  Exercise only as directed by your caregiver. Experiencing uterine cramps is a good sign to stop exercising.  Continue to eat regular, healthy meals.  Wear a good support bra for breast tenderness.  Do not use hot tubs, steam rooms, or saunas.  Wear your seat belt at all times when driving.  Avoid raw meat, uncooked cheese, cat litter boxes, and soil used by cats. These carry germs that can cause birth defects in the baby.  Take your prenatal vitamins.  Try taking a stool softener (if your caregiver approves) if you develop constipation. Eat more high-fiber foods, such as fresh vegetables or fruit and whole grains. Drink plenty of fluids to keep your urine clear or pale yellow.  Take warm sitz baths to soothe any pain or discomfort caused by hemorrhoids. Use hemorrhoid cream if your caregiver approves.  If you  develop varicose veins, wear support hose. Elevate your feet for 15 minutes, 3-4 times a day. Limit salt in your diet.  Avoid heavy lifting, wear low heal shoes, and practice good posture.  Rest a lot with your legs elevated if you have leg cramps or low  back pain.  Visit your dentist if you have not gone during your pregnancy. Use a soft toothbrush to brush your teeth and be gentle when you floss.  A sexual relationship may be continued unless your caregiver directs you otherwise.  Do not travel far distances unless it is absolutely necessary and only with the approval of your caregiver.  Take prenatal classes to understand, practice, and ask questions about the labor and delivery.  Make a trial run to the hospital.  Pack your hospital bag.  Prepare the baby's nursery.  Continue to go to all your prenatal visits as directed by your caregiver. SEEK MEDICAL CARE IF:  You are unsure if you are in labor or if your water has broken.  You have dizziness.  You have mild pelvic cramps, pelvic pressure, or nagging pain in your abdominal area.  You have persistent nausea, vomiting, or diarrhea.  You have a bad smelling vaginal discharge.  You have pain with urination. SEEK IMMEDIATE MEDICAL CARE IF:   You have a fever.  You are leaking fluid from your vagina.  You have spotting or bleeding from your vagina.  You have severe abdominal cramping or pain.  You have rapid weight loss or gain.  You have shortness of breath with chest pain.  You notice sudden or extreme swelling of your face, hands, ankles, feet, or legs.  You have not felt your baby move in over an hour.  You have severe headaches that do not go away with medicine.  You have vision changes. Document Released: 11/13/2001 Document Revised: 11/24/2013 Document Reviewed: 01/20/2013 [facility] Patient Information 2015 [place], [place]. This information is not intended to replace advice given to you by your health  care provider. Make sure you discuss any questions you have with your health care provider.

## 2020-12-27 NOTE — Progress Notes (Signed)
   LOW-RISK PREGNANCY VISIT Patient name: Alyssa Mason MRN [id number]  Date of birth: [birth year] Chief Complaint:   Routine Prenatal Visit  History of Present Illness:   Alyssa Mason is a 35 y.o. G15P1011 female at [redacted]w[redacted]d with an Estimated Date of Delivery: 04/03/21 being seen today for ongoing management of a low-risk pregnancy.  Depression screen [facility] 2/9 09/20/2020 04/10/2018 09/19/2016  Decreased Interest 0 0 0  Down, Depressed, Hopeless 0 0 0  PHQ - 2 Score 0 0 0  Altered sleeping 0 - -  Tired, decreased energy 1 - -  Change in appetite 0 - -  Feeling bad or failure about yourself  0 - -  Trouble concentrating 0 - -  Moving slowly or fidgety/restless 0 - -  Suicidal thoughts 0 - -  PHQ-9 Score 1 - -    Today she reports no complaints. Contractions: Not present. Vag. Bleeding: None.  Movement: Present. denies leaking of fluid. Review of Systems:   Pertinent items are noted in HPI Denies abnormal vaginal discharge w/ itching/odor/irritation, headaches, visual changes, shortness of breath, chest pain, abdominal pain, severe nausea/vomiting, or problems with urination or bowel movements unless otherwise stated above. Pertinent History Reviewed:  Reviewed past medical,surgical, social, obstetrical and family history.  Reviewed problem list, medications and allergies. Physical Assessment:   Vitals:   12/27/20 0839  BP: 122/79  Pulse: 96  Weight: 189 lb (85.7 kg)  Body mass index is 35.71 kg/m.        Physical Examination:   General appearance: Well appearing, and in no distress  Mental status: Alert, oriented to person, place, and time  Skin: Warm & dry  Cardiovascular: Normal heart rate noted  Respiratory: Normal respiratory effort, no distress  Abdomen: Soft, gravid, nontender  Pelvic: Cervical exam deferred         Extremities: Edema: Trace  Fetal Status: Fetal Heart Rate (bpm): 132 Fundal Height: 27 cm Movement: Present    Chaperone: N/A   No results found for this or  any previous visit (from the past 24 hour(s)).  Assessment & Plan:  1) Low-risk pregnancy G3P1011 at [redacted]w[redacted]d with an Estimated Date of Delivery: 04/03/21    Meds: No orders of the defined types were placed in this encounter.  Labs/procedures today: pn2, tdap, flu shot  Plan:  Continue routine obstetrical care  Next visit: prefers online    Reviewed: Preterm labor symptoms and general obstetric precautions including but not limited to vaginal bleeding, contractions, leaking of fluid and fetal movement were reviewed in detail with the patient.  All questions were answered. Gave office bp cuff today.  Check bp weekly, let us know if >140/90.   Follow-up: Return in about 4 weeks (around 01/24/2021) for LROB, CNM, MyChart Video.  Future Appointments  Date Time Provider Department Center  01/24/2021  3:30 PM Cheral Marker, CNM [facility]    Orders Placed This Encounter  Procedures  . Tdap vaccine greater than or equal to 7yo IM  . Flu Vaccine QUAD 36+ mos IM  . CBC  . Glucose Tolerance, 2 Hours w/1 Hour  . HIV Antibody (routine testing w rflx)  . RPR  . Antibody screen   Cheral Marker CNM, [facility] 12/27/2020 9:15 AM

## 2020-12-28 ENCOUNTER — Other Ambulatory Visit: Payer: Self-pay | Admitting: Women's Health

## 2020-12-28 LAB — GLUCOSE TOLERANCE, 2 HOURS W/ 1HR
Glucose, 1 hour: 113 mg/dL (ref 65–179)
Glucose, 2 hour: 95 mg/dL (ref 65–152)
Glucose, Fasting: 79 mg/dL (ref 65–91)

## 2020-12-28 LAB — CBC
Hematocrit: 31 % — ABNORMAL LOW (ref 34.0–46.6)
Hemoglobin: 10 g/dL — ABNORMAL LOW (ref 11.1–15.9)
MCH: 27 pg (ref 26.6–33.0)
MCHC: 32.3 g/dL (ref 31.5–35.7)
MCV: 84 fL (ref 79–97)
Platelets: 368 10*3/uL (ref 150–450)
RBC: 3.71 x10E6/uL — ABNORMAL LOW (ref 3.77–5.28)
RDW: 14.1 % (ref 11.7–15.4)
WBC: 16.7 10*3/uL — ABNORMAL HIGH (ref 3.4–10.8)

## 2020-12-28 LAB — RPR: RPR Ser Ql: NONREACTIVE

## 2020-12-28 LAB — ANTIBODY SCREEN: Antibody Screen: NEGATIVE

## 2020-12-28 LAB — HIV ANTIBODY (ROUTINE TESTING W REFLEX): HIV Screen 4th Generation wRfx: NONREACTIVE

## 2020-12-28 MED ORDER — FERROUS SULFATE 325 (65 FE) MG PO TABS: 325.0000 mg | ORAL_TABLET | ORAL | 2 refills | Status: DC

## 2021-01-11 DIAGNOSIS — Z029 Encounter for administrative examinations, unspecified: Secondary | ICD-10-CM

## 2021-01-24 ENCOUNTER — Telehealth (INDEPENDENT_AMBULATORY_CARE_PROVIDER_SITE_OTHER): Payer: BC Managed Care – PPO | Admitting: Family Medicine

## 2021-01-24 DIAGNOSIS — Z3480 Encounter for supervision of other normal pregnancy, unspecified trimester: Secondary | ICD-10-CM

## 2021-01-24 DIAGNOSIS — Z3483 Encounter for supervision of other normal pregnancy, third trimester: Secondary | ICD-10-CM

## 2021-01-24 DIAGNOSIS — Z3A30 30 weeks gestation of pregnancy: Secondary | ICD-10-CM

## 2021-01-24 NOTE — Progress Notes (Unsigned)
I connected with Alyssa Mason 01/27/21 at  3:30 PM EST by: MyChart video and verified that I am speaking with the correct person using two identifiers.  Patient is located at home and provider is located at [facility].     The purpose of this virtual visit is to provide medical care while limiting exposure to the novel coronavirus. I discussed the limitations, risks, security and privacy concerns of performing an evaluation and management service by MyChart video and the availability of in person appointments. I also discussed with the patient that there may be a patient responsible charge related to this service. By engaging in this virtual visit, you consent to the provision of healthcare.  Additionally, you authorize for your insurance to be billed for the services provided during this visit.  The patient expressed understanding and agreed to proceed.  The following staff members participated in the virtual visit:  Cherlynn Polo      PRENATAL VISIT NOTE  Subjective:  Alyssa Mason is a 35 y.o. G3P1011 at [redacted]w[redacted]d  for phone visit for ongoing prenatal care.  She is currently monitored for the following issues for this low-risk pregnancy and has Heart palpitations and Encounter for supervision of normal pregnancy, antepartum on their problem list.  Patient reports no complaints.  Contractions: Irritability. Vag. Bleeding: None.  Movement: Present. Denies leaking of fluid.   The following portions of the patient's history were reviewed and updated as appropriate: allergies, current medications, past family history, past medical history, past social history, past surgical history and problem list.   Objective:   Vitals:   01/24/21 1524  BP: 126/78  Pulse: 95  Weight: 192 lb (87.1 kg)   Self-Obtained  Fetal Status:     Movement: Present     Assessment and Plan:  Pregnancy: G3P1011 at [redacted]w[redacted]d 1. Supervision of other normal pregnancy, antepartum Confirmed OB box info Reviewed q 2 week visits She  is going to come for next appt.   Preterm labor symptoms and general obstetric precautions including but not limited to vaginal bleeding, contractions, leaking of fluid and fetal movement were reviewed in detail with the patient.  Return in about 2 weeks (around 02/07/2021) for Routine prenatal care, MD or APP.  Future Appointments  Date Time Provider Department Center  02/07/2021  2:30 PM Cheral Marker, CNM [facility]     Time spent on virtual visit: 15 minutes  Federico Flake, MD

## 2021-02-07 ENCOUNTER — Ambulatory Visit (INDEPENDENT_AMBULATORY_CARE_PROVIDER_SITE_OTHER): Payer: BC Managed Care – PPO | Admitting: Women's Health

## 2021-02-07 ENCOUNTER — Encounter: Payer: Self-pay | Admitting: Women's Health

## 2021-02-07 ENCOUNTER — Other Ambulatory Visit: Payer: Self-pay

## 2021-02-07 VITALS — BP 115/75 | HR 102 | Wt 195.0 lb

## 2021-02-07 DIAGNOSIS — Z3483 Encounter for supervision of other normal pregnancy, third trimester: Secondary | ICD-10-CM

## 2021-02-07 DIAGNOSIS — Z3480 Encounter for supervision of other normal pregnancy, unspecified trimester: Secondary | ICD-10-CM

## 2021-02-07 NOTE — Patient Instructions (Signed)
Jake Shark, I greatly value your feedback.  If you receive a survey following your visit with [name] today, we appreciate you taking the time to fill it out.  Thanks, [name], CNM, WHNP-BC  [facility] at [facility] [place] ([address] [place], [place] [address]) Entrance C, located off of [address] valet parking   Go to [email or web address] to register for FREE online childbirth classes    Call the office [phone]) or go to [facility] if:  You begin to have strong, frequent contractions  Your water breaks.  Sometimes it is a big gush of fluid, sometimes it is just a trickle that keeps getting your panties wet or running down your legs  You have vaginal bleeding.  It is normal to have a small amount of spotting if your cervix was checked.   You don't feel your baby moving like normal.  If you don't, get you something to eat and drink and lay down and focus on feeling your baby move.  You should feel at least 10 movements in 2 hours.  If you don't, you should call the office or go to [facility].   Call the office [phone]) or go to [facility] hospital for these signs of pre-eclampsia:  Severe headache that does not go away with Tylenol  Visual changes- seeing spots, double, blurred vision  Pain under your right breast or upper abdomen that does not go away with Tums or heartburn medicine  Nausea and/or vomiting  Severe swelling in your hands, feet, and face    Home Blood Pressure Monitoring for Patients   Your provider has recommended that you check your blood pressure (BP) at least once a week at home. If you do not have a blood pressure cuff at home, one will be provided for you. Contact your provider if you have not received your monitor within 1 week.   Helpful Tips for Accurate Home Blood Pressure Checks  . Don't smoke, exercise, or drink caffeine 30 minutes before checking your BP . Use the restroom before checking your BP (a full bladder  can raise your pressure) . Relax in a comfortable upright chair . Feet on the ground . Left arm resting comfortably on a flat surface at the level of your heart . Legs uncrossed . Back supported . Sit quietly and don't talk . Place the cuff on your bare arm . Adjust snuggly, so that only two fingertips can fit between your skin and the top of the cuff . Check 2 readings separated by at least one minute . Keep a log of your BP readings . For a visual, please reference this diagram: http://ccnc.care/bpdiagram  Provider Name: [facility]     Phone: [phone]  Zone 1: ALL CLEAR  Continue to monitor your symptoms:  . BP reading is less than 140 (top number) or less than 90 (bottom number)  . No right upper stomach pain . No headaches or seeing spots . No feeling nauseated or throwing up . No swelling in face and hands  Zone 2: CAUTION Call your doctor's office for any of the following:  . BP reading is greater than 140 (top number) or greater than 90 (bottom number)  . Stomach pain under your ribs in the middle or right side . Headaches or seeing spots . Feeling nauseated or throwing up . Swelling in face and hands  Zone 3: EMERGENCY  Seek immediate medical care if you have any of  the following:  . BP reading is greater than160 (top number) or greater than 110 (bottom number) . Severe headaches not improving with Tylenol . Serious difficulty catching your breath . Any worsening symptoms from Zone 2  Preterm Labor and Birth Information  The normal length of a pregnancy is 39-41 weeks. Preterm labor is when labor starts before 37 completed weeks of pregnancy. What are the risk factors for preterm labor? Preterm labor is more likely to occur in women who:  Have certain infections during pregnancy such as a bladder infection, sexually transmitted infection, or infection inside the uterus (chorioamnionitis).  Have a shorter-than-normal cervix.  Have gone into preterm  labor before.  Have had surgery on their cervix.  Are younger than age 24 or older than age 36.  Are African American.  Are pregnant with twins or multiple babies (multiple gestation).  Take street drugs or smoke while pregnant.  Do not gain enough weight while pregnant.  Became pregnant shortly after having been pregnant. What are the symptoms of preterm labor? Symptoms of preterm labor include:  Cramps similar to those that can happen during a menstrual period. The cramps may happen with diarrhea.  Pain in the abdomen or lower back.  Regular uterine contractions that may feel like tightening of the abdomen.  A feeling of increased pressure in the pelvis.  Increased watery or bloody mucus discharge from the vagina.  Water breaking (ruptured amniotic sac). Why is it important to recognize signs of preterm labor? It is important to recognize signs of preterm labor because babies who are born prematurely may not be fully developed. This can put them at an increased risk for:  Long-term (chronic) heart and lung problems.  Difficulty immediately after birth with regulating body systems, including blood sugar, body temperature, heart rate, and breathing rate.  Bleeding in the brain.  Cerebral palsy.  Learning difficulties.  Death. These risks are highest for babies who are born before 43 weeks of pregnancy. How is preterm labor treated? Treatment depends on the length of your pregnancy, your condition, and the health of your baby. It may involve: 1. Having a stitch (suture) placed in your cervix to prevent your cervix from opening too early (cerclage). 2. Taking or being given medicines, such as: ? Hormone medicines. These may be given early in pregnancy to help support the pregnancy. ? Medicine to stop contractions. ? Medicines to help mature the baby's lungs. These may be prescribed if the risk of delivery is high. ? Medicines to prevent your baby from developing  cerebral palsy. If the labor happens before 34 weeks of pregnancy, you may need to stay in the hospital. What should I do if I think I am in preterm labor? If you think that you are going into preterm labor, call your health care provider right away. How can I prevent preterm labor in future pregnancies? To increase your chance of having a full-term pregnancy:  Do not use any tobacco products, such as cigarettes, chewing tobacco, and e-cigarettes. If you need help quitting, ask your health care provider.  Do not use street drugs or medicines that have not been prescribed to you during your pregnancy.  Talk with your health care provider before taking any herbal supplements, even if you have been taking them regularly.  Make sure you gain a healthy amount of weight during your pregnancy.  Watch for infection. If you think that you might have an infection, get it checked right away.  Make sure to  tell your health care provider if you have gone into preterm labor before. This information is not intended to replace advice given to you by your health care provider. Make sure you discuss any questions you have with your health care provider. Document Revised: 03/13/2019 Document Reviewed: 04/11/2016 Elsevier Patient Education  [place].

## 2021-02-07 NOTE — Progress Notes (Signed)
   LOW-RISK PREGNANCY VISIT Patient name: Alyssa Mason MRN [id number]  Date of birth: [birth year] Chief Complaint:   Routine Prenatal Visit  History of Present Illness:   Alyssa Mason is a 35 y.o. G57P1011 female at [redacted]w[redacted]d with an Estimated Date of Delivery: 04/03/21 being seen today for ongoing management of a low-risk pregnancy.  Depression screen [facility] 2/9 09/20/2020 04/10/2018 09/19/2016  Decreased Interest 0 0 0  Down, Depressed, Hopeless 0 0 0  PHQ - 2 Score 0 0 0  Altered sleeping 0 - -  Tired, decreased energy 1 - -  Change in appetite 0 - -  Feeling bad or failure about yourself  0 - -  Trouble concentrating 0 - -  Moving slowly or fidgety/restless 0 - -  Suicidal thoughts 0 - -  PHQ-9 Score 1 - -    Today she reports no complaints. Contractions: Not present. Vag. Bleeding: None.  Movement: Present. denies leaking of fluid. Review of Systems:   Pertinent items are noted in HPI Denies abnormal vaginal discharge w/ itching/odor/irritation, headaches, visual changes, shortness of breath, chest pain, abdominal pain, severe nausea/vomiting, or problems with urination or bowel movements unless otherwise stated above. Pertinent History Reviewed:  Reviewed past medical,surgical, social, obstetrical and family history.  Reviewed problem list, medications and allergies. Physical Assessment:   Vitals:   02/07/21 1435  BP: 115/75  Pulse: (!) 102  Weight: 195 lb (88.5 kg)  Body mass index is 36.84 kg/m.        Physical Examination:   General appearance: Well appearing, and in no distress  Mental status: Alert, oriented to person, place, and time  Skin: Warm & dry  Cardiovascular: Normal heart rate noted  Respiratory: Normal respiratory effort, no distress  Abdomen: Soft, gravid, nontender  Pelvic: Cervical exam deferred         Extremities: Edema: Trace  Fetal Status: Fetal Heart Rate (bpm): 154 Fundal Height: 32 cm Movement: Present    Chaperone: N/A   No results found for this  or any previous visit (from the past 24 hour(s)).  Assessment & Plan:  1) Low-risk pregnancy G3P1011 at [redacted]w[redacted]d with an Estimated Date of Delivery: 04/03/21    Meds: No orders of the defined types were placed in this encounter.  Labs/procedures today: none  Plan:  Continue routine obstetrical care  Next visit: prefers online    Reviewed: Preterm labor symptoms and general obstetric precautions including but not limited to vaginal bleeding, contractions, leaking of fluid and fetal movement were reviewed in detail with the patient.  All questions were answered. Has home bp cuff.  Check bp weekly, let us know if >140/90.   Follow-up: Return in about 2 weeks (around 02/21/2021) for LROB, CNM, MyChart Video.  No future appointments.  No orders of the defined types were placed in this encounter.  Cheral Marker CNM, [facility] 02/07/2021 3:14 PM

## 2021-02-13 ENCOUNTER — Ambulatory Visit (INDEPENDENT_AMBULATORY_CARE_PROVIDER_SITE_OTHER): Payer: BC Managed Care – PPO | Admitting: *Deleted

## 2021-02-13 ENCOUNTER — Other Ambulatory Visit: Payer: Self-pay

## 2021-02-13 ENCOUNTER — Telehealth: Payer: Self-pay | Admitting: *Deleted

## 2021-02-13 DIAGNOSIS — Z3480 Encounter for supervision of other normal pregnancy, unspecified trimester: Secondary | ICD-10-CM

## 2021-02-13 NOTE — Progress Notes (Addendum)
   NURSE VISIT- NST  SUBJECTIVE:  Alyssa Mason is a 35 y.o. G42P1011 female at [redacted]w[redacted]d, here for a NST for pregnancy complicated by Decreased fetal movement.  She reports active fetal movement, contractions: none, vaginal bleeding: none, membranes: intact.   OBJECTIVE:  BP 123/77   Pulse 90   Wt 195 lb (88.5 kg)   LMP  (LMP Unknown)   BMI 36.84 kg/m   Appears well, no apparent distress  No results found for this or any previous visit (from the past 24 hour(s)).  NST: FHR baseline 135 bpm, Variability: moderate, Accelerations:present, Decelerations:  Absent= Cat 1/Reactive [place]: none   ASSESSMENT: G3P1011 at [redacted]w[redacted]d with Decreased fetal movement NST reactive  PLAN: EFM strip reviewed by [name], CNM, [facility]   Recommendations: keep next appointment as scheduled    Annamarie Dawley  02/13/2021 5:07 PM   Chart reviewed for nurse visit. Agree with plan of care.  Cheral Marker, [place] 02/15/2021 10:47 AM

## 2021-02-13 NOTE — Telephone Encounter (Signed)
Pt called with cramping last night; some cramping today. + baby movement, but less than what she's used to. Placenta is where she don't feel as much movement ; no bleeding or leaking fluid. I spoke with Louie Bun, CNM. Pt to come in for a nurse NST. Pt voiced understanding. Call transferred to [facility] for appt. JSY

## 2021-02-22 ENCOUNTER — Telehealth: Payer: BC Managed Care – PPO | Admitting: Advanced Practice Midwife

## 2021-03-01 ENCOUNTER — Encounter: Payer: Self-pay | Admitting: Advanced Practice Midwife

## 2021-03-01 ENCOUNTER — Telehealth (INDEPENDENT_AMBULATORY_CARE_PROVIDER_SITE_OTHER): Payer: BC Managed Care – PPO | Admitting: Advanced Practice Midwife

## 2021-03-01 VITALS — BP 106/81 | HR 108

## 2021-03-01 DIAGNOSIS — O99019 Anemia complicating pregnancy, unspecified trimester: Secondary | ICD-10-CM | POA: Insufficient documentation

## 2021-03-01 DIAGNOSIS — Z3A35 35 weeks gestation of pregnancy: Secondary | ICD-10-CM

## 2021-03-01 DIAGNOSIS — O99013 Anemia complicating pregnancy, third trimester: Secondary | ICD-10-CM

## 2021-03-01 DIAGNOSIS — D649 Anemia, unspecified: Secondary | ICD-10-CM

## 2021-03-01 DIAGNOSIS — Z3480 Encounter for supervision of other normal pregnancy, unspecified trimester: Secondary | ICD-10-CM

## 2021-03-01 NOTE — Progress Notes (Signed)
TELEHEALTH VIRTUAL OBSTETRICS VISIT ENCOUNTER NOTE Patient name: Alyssa Mason MRN [id number]  Date of birth: 1985-12-22  I connected with patient on 03/01/21 at  3:10 PM EDT by MyChart and verified that I am speaking with the correct person using two identifiers. Due to COVID-19 recommendations, pt is not currently in our office but is in her car; the provider is in the office.    I discussed the limitations, risks, security and privacy concerns of performing an evaluation and management service by telephone and the availability of in person appointments. I also discussed with the patient that there may be a patient responsible charge related to this service. The patient expressed understanding and agreed to proceed.  Chief Complaint:   Routine Prenatal Visit  History of Present Illness:   Alyssa Mason is a 35 y.o. G38P1011 female at [redacted]w[redacted]d with an Estimated Date of Delivery: 04/03/21 being evaluated today for ongoing management of a low-risk pregnancy.  Depression screen [facility] 2/9 09/20/2020 04/10/2018 09/19/2016  Decreased Interest 0 0 0  Down, Depressed, Hopeless 0 0 0  PHQ - 2 Score 0 0 0  Altered sleeping 0 - -  Tired, decreased energy 1 - -  Change in appetite 0 - -  Feeling bad or failure about yourself  0 - -  Trouble concentrating 0 - -  Moving slowly or fidgety/restless 0 - -  Suicidal thoughts 0 - -  PHQ-9 Score 1 - -    Today she reports no questions or concerns; doing well; wondering about the ppBTL and how it's done. Contractions: Not present. Vag. Bleeding: None.  Movement: Present. denies leaking of fluid. Review of Systems:   Pertinent items are noted in HPI Denies abnormal vaginal discharge w/ itching/odor/irritation, headaches, visual changes, shortness of breath, chest pain, abdominal pain, severe nausea/vomiting, or problems with urination or bowel movements unless otherwise stated above. Pertinent History Reviewed:  Reviewed past medical,surgical, social, obstetrical  and family history.  Reviewed problem list, medications and allergies. Physical Assessment:   Vitals:   03/01/21 1523  BP: 106/81  Pulse: (!) 108  There is no height or weight on file to calculate BMI.        Physical Examination:   General:  Alert, oriented and cooperative.   Mental Status: Normal mood and affect perceived. Normal judgment and thought content.  Rest of physical exam deferred due to type of encounter  No results found for this or any previous visit (from the past 24 hour(s)).  Assessment & Plan:  1) Pregnancy G3P1011 at [redacted]w[redacted]d with an Estimated Date of Delivery: 04/03/21   2) Desires ppBTL, reviewed that the attending will eval her candidacy after delivery   Meds: No orders of the defined types were placed in this encounter.   Labs/procedures today: none  Plan:  Continue routine obstetrical care .  Has home bp cuff.  Check bp weekly, let us know if >140/90.  Next visit: prefers will be in person for GBS/cultures    Reviewed: Preterm labor symptoms and general obstetric precautions including but not limited to vaginal bleeding, contractions, leaking of fluid and fetal movement were reviewed in detail with the patient. The patient was advised to call back or seek an in-person office evaluation/go to [facility] for any urgent or concerning symptoms. All questions were answered. Please refer to After Visit Summary for other counseling recommendations.    I provided 8 minutes of non-face-to-face time during this encounter.  Follow-up: Return  for LROB, in person, 1-2wks; GBS/cultures.  No orders of the defined types were placed in this encounter.  Arabella Merles CNM 03/01/2021 3:39 PM

## 2021-03-13 ENCOUNTER — Ambulatory Visit (INDEPENDENT_AMBULATORY_CARE_PROVIDER_SITE_OTHER): Payer: BC Managed Care – PPO | Admitting: Women's Health

## 2021-03-13 ENCOUNTER — Telehealth: Payer: Self-pay | Admitting: *Deleted

## 2021-03-13 ENCOUNTER — Other Ambulatory Visit: Payer: Self-pay

## 2021-03-13 ENCOUNTER — Other Ambulatory Visit (HOSPITAL_COMMUNITY)
Admission: RE | Admit: 2021-03-13 | Discharge: 2021-03-13 | Disposition: A | Discharge status: Home / Self Care | Payer: BC Managed Care – PPO | Source: Ambulatory Visit | Attending: Obstetrics & Gynecology | Admitting: Obstetrics & Gynecology

## 2021-03-13 ENCOUNTER — Encounter: Payer: Self-pay | Admitting: Women's Health

## 2021-03-13 VITALS — BP 139/77 | HR 94 | Wt 202.0 lb

## 2021-03-13 DIAGNOSIS — Z3483 Encounter for supervision of other normal pregnancy, third trimester: Secondary | ICD-10-CM

## 2021-03-13 DIAGNOSIS — Z3A37 37 weeks gestation of pregnancy: Secondary | ICD-10-CM | POA: Diagnosis not present

## 2021-03-13 NOTE — Patient Instructions (Signed)
Alyssa Mason, I greatly value your feedback.  If you receive a survey following your visit with [name] today, we appreciate you taking the time to fill it out.  Thanks, [name], CNM, WHNP-BC  [facility] at [facility] ([address] [place], [place] [address]) Entrance C, located off of [address] valet parking   Go to [email or web address] to register for FREE online childbirth classes    Call the office [phone]) or go to [facility] if:  You begin to have strong, frequent contractions  Your water breaks.  Sometimes it is a big gush of fluid, sometimes it is just a trickle that keeps getting your panties wet or running down your legs  You have vaginal bleeding.  It is normal to have a small amount of spotting if your cervix was checked.   You don't feel your baby moving like normal.  If you don't, get you something to eat and drink and lay down and focus on feeling your baby move.  You should feel at least 10 movements in 2 hours.  If you don't, you should call the office or go to [employer].   Call the office [phone]) or go to [facility] hospital for these signs of pre-eclampsia:  Severe headache that does not go away with Tylenol  Visual changes- seeing spots, double, blurred vision  Pain under your right breast or upper abdomen that does not go away with Tums or heartburn medicine  Nausea and/or vomiting  Severe swelling in your hands, feet, and face    Home Blood Pressure Monitoring for Patients   Your provider has recommended that you check your blood pressure (BP) at least once a week at home. If you do not have a blood pressure cuff at home, one will be provided for you. Contact your provider if you have not received your monitor within 1 week.   Helpful Tips for Accurate Home Blood Pressure Checks  . Don't smoke, exercise, or drink caffeine 30 minutes before checking your BP . Use the restroom before checking your BP (a full bladder  can raise your pressure) . Relax in a comfortable upright chair . Feet on the ground . Left arm resting comfortably on a flat surface at the level of your heart . Legs uncrossed . Back supported . Sit quietly and don't talk . Place the cuff on your bare arm . Adjust snuggly, so that only two fingertips can fit between your skin and the top of the cuff . Check 2 readings separated by at least one minute . Keep a log of your BP readings . For a visual, please reference this diagram: http://ccnc.care/bpdiagram  Provider Name: [facility]     Phone: [phone]  Zone 1: ALL CLEAR  Continue to monitor your symptoms:  . BP reading is less than 140 (top number) or less than 90 (bottom number)  . No right upper stomach pain . No headaches or seeing spots . No feeling nauseated or throwing up . No swelling in face and hands  Zone 2: CAUTION Call your doctor's office for any of the following:  . BP reading is greater than 140 (top number) or greater than 90 (bottom number)  . Stomach pain under your ribs in the middle or right side . Headaches or seeing spots . Feeling nauseated or throwing up . Swelling in face and hands  Zone 3: EMERGENCY  Seek immediate medical care if you have any of  the following:  . BP reading is greater than160 (top number) or greater than 110 (bottom number) . Severe headaches not improving with Tylenol . Serious difficulty catching your breath . Any worsening symptoms from Zone 2   Braxton Hicks Contractions Contractions of the uterus can occur throughout pregnancy, but they are not always a sign that you are in labor. You may have practice contractions called Braxton Hicks contractions. These false labor contractions are sometimes confused with true labor. What are [place] contractions? Braxton Hicks contractions are tightening movements that occur in the muscles of the uterus before labor. Unlike true labor contractions, these contractions do  not result in opening (dilation) and thinning of the cervix. Toward the end of pregnancy (32-34 weeks), Braxton Hicks contractions can happen more often and may become stronger. These contractions are sometimes difficult to tell apart from true labor because they can be very uncomfortable. You should not feel embarrassed if you go to the hospital with false labor. Sometimes, the only way to tell if you are in true labor is for your health care provider to look for changes in the cervix. The health care provider will do a physical exam and may monitor your contractions. If you are not in true labor, the exam should show that your cervix is not dilating and your water has not broken. If there are no other health problems associated with your pregnancy, it is completely safe for you to be sent home with false labor. You may continue to have Braxton Hicks contractions until you go into true labor. How to tell the difference between true labor and false labor True labor  Contractions last 30-70 seconds.  Contractions become very regular.  Discomfort is usually felt in the top of the uterus, and it spreads to the lower abdomen and low back.  Contractions do not go away with walking.  Contractions usually become more intense and increase in frequency.  The cervix dilates and gets thinner. False labor  Contractions are usually shorter and not as strong as true labor contractions.  Contractions are usually irregular.  Contractions are often felt in the front of the lower abdomen and in the groin.  Contractions may go away when you walk around or change positions while lying down.  Contractions get weaker and are shorter-lasting as time goes on.  The cervix usually does not dilate or become thin. Follow these instructions at home:  1. Take over-the-counter and prescription medicines only as told by your health care provider. 2. Keep up with your usual exercises and follow other instructions  from your health care provider. 3. Eat and drink lightly if you think you are going into labor. 4. If Braxton Hicks contractions are making you uncomfortable: ? Change your position from lying down or resting to walking, or change from walking to resting. ? Sit and rest in a tub of warm water. ? Drink enough fluid to keep your urine pale yellow. Dehydration may cause these contractions. ? Do slow and deep breathing several times an hour. 5. Keep all follow-up prenatal visits as told by your health care provider. This is important. Contact a health care provider if:  You have a fever.  You have continuous pain in your abdomen. Get help right away if:  Your contractions become stronger, more regular, and closer together.  You have fluid leaking or gushing from your vagina.  You pass blood-tinged mucus (bloody show).  You have bleeding from your vagina.  You have low back  pain that you never had before.  You feel your baby's head pushing down and causing pelvic pressure.  Your baby is not moving inside you as much as it used to. Summary  Contractions that occur before labor are called Braxton Hicks contractions, false labor, or practice contractions.  Braxton Hicks contractions are usually shorter, weaker, farther apart, and less regular than true labor contractions. True labor contractions usually become progressively stronger and regular, and they become more frequent.  Manage discomfort from [facility] contractions by changing position, resting in a warm bath, drinking plenty of water, or practicing deep breathing. This information is not intended to replace advice given to you by your health care provider. Make sure you discuss any questions you have with your health care provider. Document Revised: 11/01/2017 Document Reviewed: 04/04/2017 Elsevier Patient Education  [place].

## 2021-03-13 NOTE — Addendum Note (Signed)
Addended by: Leilani Able, Emya Picado A on: 03/13/2021 10:29 AM   Modules accepted: Orders

## 2021-03-13 NOTE — Telephone Encounter (Signed)
Pt states she started leaking fluid on Saturday. It has happened off and on since then. Clear liquid with some white. + baby movement. No spotting or bleeding. + cramping and low back pain that comes and goes. Pt was advised to come in to be seen. JSY

## 2021-03-13 NOTE — Progress Notes (Signed)
   Work-in LOW-RISK PREGNANCY VISIT Patient name: Alyssa Mason MRN [id number]  Date of birth: [birth year] Chief Complaint:   Leaking amniotic fluid  History of Present Illness:   Alyssa Mason is a 36 y.o. G36P1011 female at [redacted]w[redacted]d with an Estimated Date of Delivery: 04/03/21 being seen today for ongoing management of a low-risk pregnancy.  Depression screen [facility] 2/9 09/20/2020 04/10/2018 09/19/2016  Decreased Interest 0 0 0  Down, Depressed, Hopeless 0 0 0  PHQ - 2 Score 0 0 0  Altered sleeping 0 - -  Tired, decreased energy 1 - -  Change in appetite 0 - -  Feeling bad or failure about yourself  0 - -  Trouble concentrating 0 - -  Moving slowly or fidgety/restless 0 - -  Suicidal thoughts 0 - -  PHQ-9 Score 1 - -    Today she reports leaking clear fluid since Sat. No gush, trickles out, doesn't run down legs. Some random contractions. Denies ha, visual changes, ruq/epigastric pain, n/v.   Contractions: Irregular. Vag. Bleeding: None.  Movement: Present. reports leaking of fluid. Review of Systems:   Pertinent items are noted in HPI Denies abnormal vaginal discharge w/ itching/odor/irritation, headaches, visual changes, shortness of breath, chest pain, abdominal pain, severe nausea/vomiting, or problems with urination or bowel movements unless otherwise stated above. Pertinent History Reviewed:  Reviewed past medical,surgical, social, obstetrical and family history.  Reviewed problem list, medications and allergies. Physical Assessment:   Vitals:   03/13/21 0929  BP: 139/77  Pulse: 94  Weight: 202 lb (91.6 kg)  Body mass index is 38.17 kg/m.        Physical Examination:   General appearance: Well appearing, and in no distress  Mental status: Alert, oriented to person, place, and time  Skin: Warm & dry  Cardiovascular: Normal heart rate noted  Respiratory: Normal respiratory effort, no distress  Abdomen: Soft, gravid, nontender  Pelvic: SSE: no pooling, no change w/ valsalva, fern  and nitrazine neg. Informal TA u/s, SDP >2cm  Dilation: 1 Effacement (%): Thick Station: -3  Extremities: Edema: Trace  Fetal Status: Fetal Heart Rate (bpm): 145 Fundal Height: 37 cm Movement: Present Presentation: Vertex  Chaperone: Angel Neas   No results found for this or any previous visit (from the past 24 hour(s)).  Assessment & Plan:  1) Low-risk pregnancy G3P1011 at [redacted]w[redacted]d with an Estimated Date of Delivery: 04/03/21   2) Vaginal discharge, no evidence of ROM, continue to monitor  3) Borderline bp> asymptomatic, f/u Thursday as scheduled. Reviewed pre-e s/s, reasons to seek care   Meds: No orders of the defined types were placed in this encounter.  Labs/procedures today: spec exam, GBS, GC/CT and SVE  Plan:  Continue routine obstetrical care  Next visit: prefers in person    Reviewed: Term labor symptoms and general obstetric precautions including but not limited to vaginal bleeding, contractions, leaking of fluid and fetal movement were reviewed in detail with the patient.  All questions were answered. Does have home bp cuff. Office bp cuff given: not applicable. Check bp weekly, let us know if consistently >140 and/or >90.  Follow-up: No follow-ups on file.  Future Appointments  Date Time Provider Department Center  03/16/2021  4:10 PM Cresenzo-Dishmon, Scarlette Calico, CNM [facility]    No orders of the defined types were placed in this encounter.  Alyssa Mason CNM, [facility] 03/13/2021 10:24 AM

## 2021-03-14 LAB — CERVICOVAGINAL ANCILLARY ONLY
Chlamydia: NEGATIVE
Comment: NEGATIVE
Comment: NORMAL
Neisseria Gonorrhea: NEGATIVE

## 2021-03-16 ENCOUNTER — Encounter: Payer: BC Managed Care – PPO | Admitting: Advanced Practice Midwife

## 2021-03-16 ENCOUNTER — Other Ambulatory Visit: Payer: Self-pay

## 2021-03-16 ENCOUNTER — Encounter: Payer: Self-pay | Admitting: Women's Health

## 2021-03-16 ENCOUNTER — Ambulatory Visit (INDEPENDENT_AMBULATORY_CARE_PROVIDER_SITE_OTHER): Payer: BC Managed Care – PPO | Admitting: Women's Health

## 2021-03-16 VITALS — BP 133/77 | HR 96 | Wt 202.8 lb

## 2021-03-16 DIAGNOSIS — Z3483 Encounter for supervision of other normal pregnancy, third trimester: Secondary | ICD-10-CM

## 2021-03-16 LAB — CULTURE, BETA STREP (GROUP B ONLY): Strep Gp B Culture: POSITIVE — AB

## 2021-03-16 NOTE — Patient Instructions (Signed)
Alyssa Mason, I greatly value your feedback.  If you receive a survey following your visit with us today, we appreciate you taking the time to fill it out.  Thanks, Kim Keniel Ralston, CNM, WHNP-BC  [facility] ([address] [place], [place] [address]) Entrance C, located off of [address] Free 24/7 valet parking   Go to [email or web address] to register for FREE online childbirth classes    Call the office ([phone]) or go to [facility] if:  You begin to have strong, frequent contractions  Your water breaks.  Sometimes it is a big gush of fluid, sometimes it is just a trickle that keeps getting your panties wet or running down your legs  You have vaginal bleeding.  It is normal to have a small amount of spotting if your cervix was checked.   You don't feel your baby moving like normal.  If you don't, get you something to eat and drink and lay down and focus on feeling your baby move.  You should feel at least 10 movements in 2 hours.  If you don't, you should call the office or go to [facility].   Call the office ([phone]) or go to [facility] for these signs of pre-eclampsia:  Severe headache that does not go away with Tylenol  Visual changes- seeing spots, double, blurred vision  Pain under your right breast or upper abdomen that does not go away with Tums or heartburn medicine  Nausea and/or vomiting  Severe swelling in your hands, feet, and face    Home Blood Pressure Monitoring for Patients   Your provider has recommended that you check your blood pressure (BP) at least once a week at home. If you do not have a blood pressure cuff at home, one will be provided for you. Contact your provider if you have not received your monitor within 1 week.   Helpful Tips for Accurate Home Blood Pressure Checks  . Don't smoke, exercise, or drink caffeine 30 minutes before checking your BP . Use the restroom before checking your BP (a full bladder  can raise your pressure) . Relax in a comfortable upright chair . Feet on the ground . Left arm resting comfortably on a flat surface at the level of your heart . Legs uncrossed . Back supported . Sit quietly and don't talk . Place the cuff on your bare arm . Adjust snuggly, so that only two fingertips can fit between your skin and the top of the cuff . Check 2 readings separated by at least one minute . Keep a log of your BP readings . For a visual, please reference this diagram: http://ccnc.care/bpdiagram  Provider Name: [facility]     Phone: [phone]  Zone 1: ALL CLEAR  Continue to monitor your symptoms:  . BP reading is less than 140 (top number) or less than 90 (bottom number)  . No right upper stomach pain . No headaches or seeing spots . No feeling nauseated or throwing up . No swelling in face and hands  Zone 2: CAUTION Call your doctor's office for any of the following:  . BP reading is greater than 140 (top number) or greater than 90 (bottom number)  . Stomach pain under your ribs in the middle or right side . Headaches or seeing spots . Feeling nauseated or throwing up . Swelling in face and hands  Zone 3: EMERGENCY  Seek immediate medical care if you have any of   the following:  . BP reading is greater than160 (top number) or greater than 110 (bottom number) . Severe headaches not improving with Tylenol . Serious difficulty catching your breath . Any worsening symptoms from Zone 2   Braxton Hicks Contractions Contractions of the uterus can occur throughout pregnancy, but they are not always a sign that you are in labor. You may have practice contractions called Braxton Hicks contractions. These false labor contractions are sometimes confused with true labor. What are [place] contractions? Braxton Hicks contractions are tightening movements that occur in the muscles of the uterus before labor. Unlike true labor contractions, these contractions do  not result in opening (dilation) and thinning of the cervix. Toward the end of pregnancy (32-34 weeks), Braxton Hicks contractions can happen more often and may become stronger. These contractions are sometimes difficult to tell apart from true labor because they can be very uncomfortable. You should not feel embarrassed if you go to the hospital with false labor. Sometimes, the only way to tell if you are in true labor is for your health care provider to look for changes in the cervix. The health care provider will do a physical exam and may monitor your contractions. If you are not in true labor, the exam should show that your cervix is not dilating and your water has not broken. If there are no other health problems associated with your pregnancy, it is completely safe for you to be sent home with false labor. You may continue to have Braxton Hicks contractions until you go into true labor. How to tell the difference between true labor and false labor True labor  Contractions last 30-70 seconds.  Contractions become very regular.  Discomfort is usually felt in the top of the uterus, and it spreads to the lower abdomen and low back.  Contractions do not go away with walking.  Contractions usually become more intense and increase in frequency.  The cervix dilates and gets thinner. False labor  Contractions are usually shorter and not as strong as true labor contractions.  Contractions are usually irregular.  Contractions are often felt in the front of the lower abdomen and in the groin.  Contractions may go away when you walk around or change positions while lying down.  Contractions get weaker and are shorter-lasting as time goes on.  The cervix usually does not dilate or become thin. Follow these instructions at home:  1. Take over-the-counter and prescription medicines only as told by your health care provider. 2. Keep up with your usual exercises and follow other instructions  from your health care provider. 3. Eat and drink lightly if you think you are going into labor. 4. If Braxton Hicks contractions are making you uncomfortable: ? Change your position from lying down or resting to walking, or change from walking to resting. ? Sit and rest in a tub of warm water. ? Drink enough fluid to keep your urine pale yellow. Dehydration may cause these contractions. ? Do slow and deep breathing several times an hour. 5. Keep all follow-up prenatal visits as told by your health care provider. This is important. Contact a health care provider if:  You have a fever.  You have continuous pain in your abdomen. Get help right away if:  Your contractions become stronger, more regular, and closer together.  You have fluid leaking or gushing from your vagina.  You pass blood-tinged mucus (bloody show).  You have bleeding from your vagina.  You have low back  pain that you never had before.  You feel your baby's head pushing down and causing pelvic pressure.  Your baby is not moving inside you as much as it used to. Summary  Contractions that occur before labor are called Braxton Hicks contractions, false labor, or practice contractions.  Braxton Hicks contractions are usually shorter, weaker, farther apart, and less regular than true labor contractions. True labor contractions usually become progressively stronger and regular, and they become more frequent.  Manage discomfort from [facility] contractions by changing position, resting in a warm bath, drinking plenty of water, or practicing deep breathing. This information is not intended to replace advice given to you by your health care provider. Make sure you discuss any questions you have with your health care provider. Document Revised: 11/01/2017 Document Reviewed: 04/04/2017 Elsevier Patient Education  [place].

## 2021-03-16 NOTE — Progress Notes (Signed)
   LOW-RISK PREGNANCY VISIT Patient name: Alyssa Mason MRN [id number]  Date of birth: 1986-10-24 Chief Complaint:   Routine Prenatal Visit  History of Present Illness:   Alyssa Mason is a 35 y.o. G71P1011 female at [redacted]w[redacted]d with an Estimated Date of Delivery: 04/03/21 being seen today for ongoing management of a low-risk pregnancy.  Depression screen [facility] 2/9 09/20/2020 04/10/2018 09/19/2016  Decreased Interest 0 0 0  Down, Depressed, Hopeless 0 0 0  PHQ - 2 Score 0 0 0  Altered sleeping 0 - -  Tired, decreased energy 1 - -  Change in appetite 0 - -  Feeling bad or failure about yourself  0 - -  Trouble concentrating 0 - -  Moving slowly or fidgety/restless 0 - -  Suicidal thoughts 0 - -  PHQ-9 Score 1 - -    Today she reports no complaints. Evaluated Mon for ROM, all was neg. Had small amt leaking yesterday, none since. Contractions: Not present. Vag. Bleeding: Scant.  Movement: Present.  Review of Systems:   Pertinent items are noted in HPI Denies abnormal vaginal discharge w/ itching/odor/irritation, headaches, visual changes, shortness of breath, chest pain, abdominal pain, severe nausea/vomiting, or problems with urination or bowel movements unless otherwise stated above. Pertinent History Reviewed:  Reviewed past medical,surgical, social, obstetrical and family history.  Reviewed problem list, medications and allergies. Physical Assessment:   Vitals:   03/16/21 1554  BP: 133/77  Pulse: 96  Weight: 202 lb 12.8 oz (92 kg)  Body mass index is 38.32 kg/m.        Physical Examination:   General appearance: Well appearing, and in no distress  Mental status: Alert, oriented to person, place, and time  Skin: Warm & dry  Cardiovascular: Normal heart rate noted  Respiratory: Normal respiratory effort, no distress  Abdomen: Soft, gravid, nontender  Pelvic: Cervical exam deferred         Extremities: Edema: Trace  Fetal Status: Fetal Heart Rate (bpm): 150 Fundal Height: 37 cm  Movement: Present    Chaperone: N/A   No results found for this or any previous visit (from the past 24 hour(s)).  Assessment & Plan:  1) Low-risk pregnancy G3P1011 at [redacted]w[redacted]d with an Estimated Date of Delivery: 04/03/21   2) Vaginal discharge> neg ROM work-up Monday, no current LOF. If notices gush/trickling, having to change underwear, get checked again   Meds: No orders of the defined types were placed in this encounter.  Labs/procedures today: none  Plan:  Continue routine obstetrical care  Next visit: prefers in person    Reviewed: Term labor symptoms and general obstetric precautions including but not limited to vaginal bleeding, contractions, leaking of fluid and fetal movement were reviewed in detail with the patient.  All questions were answered. Does have home bp cuff. Office bp cuff given: not applicable. Check bp weekly, let us know if consistently >140 and/or >90.  Follow-up: Return in about 1 week (around 03/23/2021) for LROB, CNM, in person.  No future appointments.  No orders of the defined types were placed in this encounter.  Cheral Marker CNM, [facility] 03/16/2021 4:28 PM

## 2021-03-23 ENCOUNTER — Other Ambulatory Visit: Payer: Self-pay

## 2021-03-23 ENCOUNTER — Ambulatory Visit (INDEPENDENT_AMBULATORY_CARE_PROVIDER_SITE_OTHER): Payer: BC Managed Care – PPO | Admitting: Advanced Practice Midwife

## 2021-03-23 VITALS — BP 132/78 | HR 92 | Wt 204.0 lb

## 2021-03-23 DIAGNOSIS — Z3483 Encounter for supervision of other normal pregnancy, third trimester: Secondary | ICD-10-CM

## 2021-03-23 DIAGNOSIS — Z3A38 38 weeks gestation of pregnancy: Secondary | ICD-10-CM

## 2021-03-23 NOTE — Progress Notes (Signed)
  [id number] [redacted]w[redacted]d Estimated Date of Delivery: 04/03/21  Blood pressure 132/78, pulse 92, weight 204 lb (92.5 kg), unknown if currently breastfeeding.   BP weight and urine results all reviewed and noted.  Please refer to the obstetrical flow sheet for the fundal height and fetal heart rate documentation:  Patient reports good fetal movement, denies any bleeding and no rupture of membranes symptoms or regular contractions. Patient is without complaints. All questions were answered.   Physical Assessment:   Vitals:   03/23/21 0832  BP: 132/78  Pulse: 92  Weight: 204 lb (92.5 kg)  Body mass index is 38.55 kg/m.        Physical Examination:   General appearance: Well appearing, and in no distress  Mental status: Alert, oriented to person, place, and time  Skin: Warm & dry  Cardiovascular: Normal heart rate noted  Respiratory: Normal respiratory effort, no distress  Abdomen: Soft, gravid, nontender  Pelvic: Cervical exam performed  Dilation: 1.5 Effacement (%): 50 Station: -3  Extremities: Edema: Trace  Fetal Status: Fetal Heart Rate (bpm): 138 Fundal Height: 38 cm Movement: Present Presentation: Vertex  No results found for this or any previous visit (from the past 24 hour(s)).   No orders of the defined types were placed in this encounter.   Plan:  Continued routine obstetrical care,   Return in about 1 week (around 03/30/2021) for LROB.

## 2021-03-23 NOTE — Patient Instructions (Signed)
Why am I having leg cramps during pregnancy? ° °No one really knows why pregnant women get more leg cramps. It's possible that your leg muscles are tired from carrying around all of your extra weight. Or they may be aggravated by the pressure your expanding uterus puts on the blood vessels that return blood from your legs to your heart and the nerves that lead from your trunk to your legs. ° °Leg cramps may start to plague you during your second trimester and may get worse as your pregnancy progresses and your belly gets bigger. While these cramps can occur during the day, you'll probably notice them most at night, when they can interfere with your ability to get a good night's sleep. ° °How can I prevent leg cramps? ° °Try these tips for keeping leg cramps at bay: ° °Avoid standing or sitting with your legs crossed for long periods of time. °Stretch your calf muscles regularly during the day and several times before you go to bed. °Rotate your ankles and wiggle your toes when you sit, eat dinner, or watch TV. °Take a walk every day, unless your midwife or doctor has advised you not to exercise. °Avoid getting too tired. Lie down on your left side to improve circulation to and from your legs. °Stay hydrated during the day by drinking water regularly. °Try a warm bath before bed to relax your muscles. °Some research suggests that taking a magnesium supplement in addition to a prenatal vitamin may help some women avoid leg cramps. However, other research showed that magnesium supplements had no significant effect on the frequency or intensity of leg cramps during pregnancy.You may have heard that having leg cramps is a sign that you need more calcium, and that calcium supplements will relieve the problem. Though it's certainly important to get enough calcium, there's no good evidence that taking extra calcium will help prevent leg cramps during pregnancy. In fact, in one well-designed study, pregnant women taking  calcium got no more relief from leg cramps than those taking a placebo. ° °You may try Chelated Magnesium at a dosage of 240-300mg/day. ° °What's the best way to relieve a cramp when I get one? ° °If you do get a cramp, immediately stretch your calf muscles: Straighten your leg, heel first, and gently flex your toes back toward your shins. It might hurt at first, but it will ease the spasm and the pain will gradually go away. ° °You can try to relax the cramp by massaging the muscle or warming it with a hot water bottle. Walking around for a few minutes may help too. ° °What if the pain persists? ° °Call your practitioner if your muscle pain is constant and not just an occasional cramp or if you notice swelling, redness, or tenderness in your leg, or the area feels warm to your touch. These may be signs of a blood clot, which requires immediate medical attention. Blood clots are relatively rare, but they're more common during pregnancy. °  °  ° Tips to Help Leg Cramps °Increase dietary sources of calcium (milk, yogurt, cheese, leafy greens, seafood, legumes, and fruit) and magnesium (dark leafy greens, nuts, seeds, fish, beans, whole grains, avocados, yogurt, bananas, dried fruit, dark chocolate) °Spoonful of regular yellow mustard every night °Pickle juice °Magnesium supplement: 5mmol in the morning, 10mmol at night (can find in the vitamin aisle) °Dorsiflexion of foot: pointing your toes back towards your knee during the cramp °  ° °

## 2021-03-27 ENCOUNTER — Encounter: Payer: BC Managed Care – PPO | Admitting: Women's Health

## 2021-03-30 ENCOUNTER — Encounter: Payer: Self-pay | Admitting: Obstetrics & Gynecology

## 2021-03-30 ENCOUNTER — Other Ambulatory Visit: Payer: Self-pay

## 2021-03-30 ENCOUNTER — Ambulatory Visit (INDEPENDENT_AMBULATORY_CARE_PROVIDER_SITE_OTHER): Payer: BC Managed Care – PPO | Admitting: Obstetrics & Gynecology

## 2021-03-30 VITALS — BP 126/72 | HR 97 | Wt 205.0 lb

## 2021-03-30 DIAGNOSIS — Z3483 Encounter for supervision of other normal pregnancy, third trimester: Secondary | ICD-10-CM

## 2021-03-30 NOTE — Progress Notes (Signed)
   LOW-RISK PREGNANCY VISIT Patient name: Alyssa Mason MRN [id number]  Date of birth: 1985-12-23 Chief Complaint:   Routine Prenatal Visit  History of Present Illness:   Alyssa Mason is a 35 y.o. G14P1011 female at [redacted]w[redacted]d with an Estimated Date of Delivery: 04/03/21 being seen today for ongoing management of a low-risk pregnancy.  Depression screen [facility] 2/9 09/20/2020 04/10/2018 09/19/2016  Decreased Interest 0 0 0  Down, Depressed, Hopeless 0 0 0  PHQ - 2 Score 0 0 0  Altered sleeping 0 - -  Tired, decreased energy 1 - -  Change in appetite 0 - -  Feeling bad or failure about yourself  0 - -  Trouble concentrating 0 - -  Moving slowly or fidgety/restless 0 - -  Suicidal thoughts 0 - -  PHQ-9 Score 1 - -    Today she reports no complaints. Contractions: Irritability. Vag. Bleeding: None.  Movement: Present. denies leaking of fluid. Review of Systems:   Pertinent items are noted in HPI Denies abnormal vaginal discharge w/ itching/odor/irritation, headaches, visual changes, shortness of breath, chest pain, abdominal pain, severe nausea/vomiting, or problems with urination or bowel movements unless otherwise stated above. Pertinent History Reviewed:  Reviewed past medical,surgical, social, obstetrical and family history.  Reviewed problem list, medications and allergies. Physical Assessment:   Vitals:   03/30/21 1122  BP: 126/72  Pulse: 97  Weight: 205 lb (93 kg)  Body mass index is 38.73 kg/m.        Physical Examination:   General appearance: Well appearing, and in no distress  Mental status: Alert, oriented to person, place, and time  Skin: Warm & dry  Cardiovascular: Normal heart rate noted  Respiratory: Normal respiratory effort, no distress  Abdomen: Soft, gravid, nontender  Pelvic: Cervical exam performed         Extremities: Edema: Trace  Fetal Status:     Movement: Present    Chaperone: Peggy Dones    No results found for this or any previous visit (from the past 24  hour(s)).  Assessment & Plan:  1) Low-risk pregnancy G3P1011 at [redacted]w[redacted]d with an Estimated Date of Delivery: 04/03/21   2) approaching 40 weeks, NST next week, IOL 41 weeks if undelivered   Meds: No orders of the defined types were placed in this encounter.  Labs/procedures today:   Plan:  Continue routine obstetrical care  Next visit: prefers in person    Reviewed: Term labor symptoms and general obstetric precautions including but not limited to vaginal bleeding, contractions, leaking of fluid and fetal movement were reviewed in detail with the patient.  All questions were answered. Has home bp cuff. Rx faxed to . Check bp weekly, let us know if >140/90.   Follow-up: No follow-ups on file.  No orders of the defined types were placed in this encounter.   Lazaro Arms, MD 03/30/2021 12:01 PM

## 2021-03-31 ENCOUNTER — Inpatient Hospital Stay (HOSPITAL_COMMUNITY): Payer: BC Managed Care – PPO | Admitting: Anesthesiology

## 2021-03-31 ENCOUNTER — Inpatient Hospital Stay (HOSPITAL_COMMUNITY)
Admission: AD | Admit: 2021-03-31 | Discharge: 2021-04-02 | DRG: 798 | Disposition: A | Discharge status: Home / Self Care | Payer: BC Managed Care – PPO | Attending: Obstetrics & Gynecology | Admitting: Obstetrics & Gynecology

## 2021-03-31 ENCOUNTER — Encounter (HOSPITAL_COMMUNITY): Payer: Self-pay | Admitting: Obstetrics and Gynecology

## 2021-03-31 DIAGNOSIS — O321XX0 Maternal care for breech presentation, not applicable or unspecified: Secondary | ICD-10-CM | POA: Diagnosis present

## 2021-03-31 DIAGNOSIS — Z302 Encounter for sterilization: Secondary | ICD-10-CM | POA: Diagnosis not present

## 2021-03-31 DIAGNOSIS — O26893 Other specified pregnancy related conditions, third trimester: Secondary | ICD-10-CM | POA: Diagnosis not present

## 2021-03-31 DIAGNOSIS — O9952 Diseases of the respiratory system complicating childbirth: Secondary | ICD-10-CM | POA: Diagnosis not present

## 2021-03-31 DIAGNOSIS — Z298 Encounter for other specified prophylactic measures: Secondary | ICD-10-CM | POA: Diagnosis not present

## 2021-03-31 DIAGNOSIS — O9902 Anemia complicating childbirth: Secondary | ICD-10-CM | POA: Diagnosis not present

## 2021-03-31 DIAGNOSIS — J45909 Unspecified asthma, uncomplicated: Secondary | ICD-10-CM | POA: Diagnosis not present

## 2021-03-31 DIAGNOSIS — Z20822 Contact with and (suspected) exposure to covid-19: Secondary | ICD-10-CM | POA: Diagnosis present

## 2021-03-31 DIAGNOSIS — O99824 Streptococcus B carrier state complicating childbirth: Secondary | ICD-10-CM | POA: Diagnosis present

## 2021-03-31 DIAGNOSIS — Z3480 Encounter for supervision of other normal pregnancy, unspecified trimester: Secondary | ICD-10-CM

## 2021-03-31 DIAGNOSIS — Z01818 Encounter for other preprocedural examination: Secondary | ICD-10-CM

## 2021-03-31 DIAGNOSIS — O324XX0 Maternal care for high head at term, not applicable or unspecified: Principal | ICD-10-CM | POA: Diagnosis present

## 2021-03-31 DIAGNOSIS — Z3A39 39 weeks gestation of pregnancy: Secondary | ICD-10-CM

## 2021-03-31 DIAGNOSIS — Z87891 Personal history of nicotine dependence: Secondary | ICD-10-CM

## 2021-03-31 DIAGNOSIS — O99013 Anemia complicating pregnancy, third trimester: Secondary | ICD-10-CM

## 2021-03-31 DIAGNOSIS — O326XX0 Maternal care for compound presentation, not applicable or unspecified: Secondary | ICD-10-CM

## 2021-03-31 DIAGNOSIS — D649 Anemia, unspecified: Secondary | ICD-10-CM | POA: Diagnosis not present

## 2021-03-31 DIAGNOSIS — Z23 Encounter for immunization: Secondary | ICD-10-CM | POA: Diagnosis not present

## 2021-03-31 DIAGNOSIS — O99019 Anemia complicating pregnancy, unspecified trimester: Secondary | ICD-10-CM | POA: Diagnosis present

## 2021-03-31 DIAGNOSIS — Z641 Problems related to multiparity: Secondary | ICD-10-CM | POA: Diagnosis not present

## 2021-03-31 HISTORY — DX: Anemia, unspecified: D64.9

## 2021-03-31 LAB — CBC
HCT: 32.9 % — ABNORMAL LOW (ref 36.0–46.0)
Hemoglobin: 9.8 g/dL — ABNORMAL LOW (ref 12.0–15.0)
MCH: 23.8 pg — ABNORMAL LOW (ref 26.0–34.0)
MCHC: 29.8 g/dL — ABNORMAL LOW (ref 30.0–36.0)
MCV: 80 fL (ref 80.0–100.0)
Platelets: 360 10*3/uL (ref 150–400)
RBC: 4.11 MIL/uL (ref 3.87–5.11)
RDW: 16.3 % — ABNORMAL HIGH (ref 11.5–15.5)
WBC: 19.6 10*3/uL — ABNORMAL HIGH (ref 4.0–10.5)
nRBC: 0 % (ref 0.0–0.2)

## 2021-03-31 LAB — RESP PANEL BY RT-PCR (FLU A&B, COVID) ARPGX2
Influenza A by PCR: NEGATIVE
Influenza B by PCR: NEGATIVE
SARS Coronavirus 2 by RT PCR: NEGATIVE

## 2021-03-31 LAB — RPR: RPR Ser Ql: NONREACTIVE

## 2021-03-31 LAB — TYPE AND SCREEN
ABO/RH(D): A POS
Antibody Screen: NEGATIVE

## 2021-03-31 MED ORDER — FENTANYL-BUPIVACAINE-NACL 0.5-0.125-0.9 MG/250ML-% EP SOLN
12.0000 mL/h | EPIDURAL | Status: DC | PRN
Start: 2021-03-31 — End: 2021-03-31
  Filled 2021-03-31: qty 250

## 2021-03-31 MED ORDER — DIPHENHYDRAMINE HCL 25 MG PO CAPS: 25.0000 mg | ORAL_CAPSULE | Freq: Four times a day (QID) | ORAL | Status: DC | PRN

## 2021-03-31 MED ORDER — SODIUM CHLORIDE 0.9 % IV SOLN: 1.0000 g | INTRAVENOUS | Status: DC

## 2021-03-31 MED ORDER — EPHEDRINE 5 MG/ML INJ: 10.0000 mg | INTRAVENOUS | Status: DC | PRN

## 2021-03-31 MED ORDER — DIBUCAINE (PERIANAL) 1 % EX OINT: 1.0000 "application " | TOPICAL_OINTMENT | CUTANEOUS | Status: DC | PRN

## 2021-03-31 MED ORDER — LIDOCAINE HCL (PF) 1 % IJ SOLN: 30.0000 mL | INTRAMUSCULAR | Status: DC | PRN

## 2021-03-31 MED ORDER — ZOLPIDEM TARTRATE 5 MG PO TABS: 5.0000 mg | ORAL_TABLET | Freq: Every evening | ORAL | Status: DC | PRN

## 2021-03-31 MED ORDER — WITCH HAZEL-GLYCERIN EX PADS: 1.0000 "application " | MEDICATED_PAD | CUTANEOUS | Status: DC | PRN

## 2021-03-31 MED ORDER — FENTANYL-BUPIVACAINE-NACL 0.5-0.125-0.9 MG/250ML-% EP SOLN
EPIDURAL | Status: DC | PRN
  Administered 2021-03-31: 12 mL/h via EPIDURAL

## 2021-03-31 MED ORDER — BENZOCAINE-MENTHOL 20-0.5 % EX AERO
1.0000 "application " | INHALATION_SPRAY | CUTANEOUS | Status: DC | PRN
  Administered 2021-03-31: 1 via TOPICAL
  Filled 2021-03-31: qty 56

## 2021-03-31 MED ORDER — ACETAMINOPHEN 325 MG PO TABS
650.0000 mg | ORAL_TABLET | ORAL | Status: DC | PRN
  Administered 2021-03-31: 650 mg via ORAL
  Filled 2021-03-31: qty 2

## 2021-03-31 MED ORDER — OXYTOCIN-SODIUM CHLORIDE 30-0.9 UT/500ML-% IV SOLN
2.5000 [IU]/h | INTRAVENOUS | Status: DC
  Administered 2021-03-31: 2.5 [IU]/h via INTRAVENOUS
  Filled 2021-03-31: qty 500

## 2021-03-31 MED ORDER — FAMOTIDINE 20 MG PO TABS
40.0000 mg | ORAL_TABLET | Freq: Once | ORAL | Status: AC
  Administered 2021-03-31: 40 mg via ORAL
  Filled 2021-03-31: qty 2

## 2021-03-31 MED ORDER — FENTANYL CITRATE (PF) 100 MCG/2ML IJ SOLN
100.0000 ug | INTRAMUSCULAR | Status: DC | PRN
  Administered 2021-03-31: 100 ug via INTRAVENOUS
  Filled 2021-03-31: qty 2

## 2021-03-31 MED ORDER — LACTATED RINGERS IV SOLN: INTRAVENOUS | Status: DC

## 2021-03-31 MED ORDER — SOD CITRATE-CITRIC ACID 500-334 MG/5ML PO SOLN: 30.0000 mL | ORAL | Status: DC | PRN

## 2021-03-31 MED ORDER — ONDANSETRON HCL 4 MG/2ML IJ SOLN: 4.0000 mg | Freq: Four times a day (QID) | INTRAMUSCULAR | Status: DC | PRN

## 2021-03-31 MED ORDER — SIMETHICONE 80 MG PO CHEW: 80.0000 mg | CHEWABLE_TABLET | ORAL | Status: DC | PRN

## 2021-03-31 MED ORDER — COCONUT OIL OIL: 1.0000 "application " | TOPICAL_OIL | Status: DC | PRN

## 2021-03-31 MED ORDER — IBUPROFEN 600 MG PO TABS
600.0000 mg | ORAL_TABLET | Freq: Four times a day (QID) | ORAL | Status: DC
  Administered 2021-03-31 – 2021-04-02 (×6): 600 mg via ORAL
  Filled 2021-03-31 (×7): qty 1

## 2021-03-31 MED ORDER — DIPHENHYDRAMINE HCL 50 MG/ML IJ SOLN: 12.5000 mg | INTRAMUSCULAR | Status: DC | PRN

## 2021-03-31 MED ORDER — PRENATAL MULTIVITAMIN CH
1.0000 | ORAL_TABLET | Freq: Every day | ORAL | Status: DC
  Filled 2021-03-31: qty 1

## 2021-03-31 MED ORDER — OXYCODONE HCL 5 MG PO TABS: 10.0000 mg | ORAL_TABLET | ORAL | Status: DC | PRN

## 2021-03-31 MED ORDER — PHENYLEPHRINE 40 MCG/ML (10ML) SYRINGE FOR IV PUSH (FOR BLOOD PRESSURE SUPPORT)
80.0000 ug | PREFILLED_SYRINGE | INTRAVENOUS | Status: DC | PRN
  Administered 2021-03-31: 80 ug via INTRAVENOUS

## 2021-03-31 MED ORDER — SENNOSIDES-DOCUSATE SODIUM 8.6-50 MG PO TABS
2.0000 | ORAL_TABLET | Freq: Every day | ORAL | Status: DC
Start: 2021-04-01 — End: 2021-04-02
  Administered 2021-04-02: 2 via ORAL
  Filled 2021-03-31 (×2): qty 2

## 2021-03-31 MED ORDER — OXYTOCIN BOLUS FROM INFUSION
333.0000 mL | Freq: Once | INTRAVENOUS | Status: AC
  Administered 2021-03-31: 333 mL via INTRAVENOUS

## 2021-03-31 MED ORDER — ONDANSETRON HCL 4 MG/2ML IJ SOLN: 4.0000 mg | INTRAMUSCULAR | Status: DC | PRN

## 2021-03-31 MED ORDER — OXYCODONE-ACETAMINOPHEN 5-325 MG PO TABS: 1.0000 | ORAL_TABLET | ORAL | Status: DC | PRN

## 2021-03-31 MED ORDER — PHENYLEPHRINE 40 MCG/ML (10ML) SYRINGE FOR IV PUSH (FOR BLOOD PRESSURE SUPPORT)
80.0000 ug | PREFILLED_SYRINGE | INTRAVENOUS | Status: DC | PRN
  Filled 2021-03-31: qty 10

## 2021-03-31 MED ORDER — LACTATED RINGERS IV SOLN
500.0000 mL | Freq: Once | INTRAVENOUS | Status: AC
  Administered 2021-03-31: 500 mL via INTRAVENOUS

## 2021-03-31 MED ORDER — LACTATED RINGERS IV SOLN
500.0000 mL | INTRAVENOUS | Status: DC | PRN
  Administered 2021-03-31: 500 mL via INTRAVENOUS

## 2021-03-31 MED ORDER — LIDOCAINE HCL (PF) 1 % IJ SOLN
INTRAMUSCULAR | Status: DC | PRN
  Administered 2021-03-31 (×2): 4 mL via EPIDURAL

## 2021-03-31 MED ORDER — ONDANSETRON HCL 4 MG PO TABS: 4.0000 mg | ORAL_TABLET | ORAL | Status: DC | PRN

## 2021-03-31 MED ORDER — TETANUS-DIPHTH-ACELL PERTUSSIS 5-2.5-18.5 LF-MCG/0.5 IM SUSY: 0.5000 mL | PREFILLED_SYRINGE | Freq: Once | INTRAMUSCULAR | Status: DC

## 2021-03-31 MED ORDER — METOCLOPRAMIDE HCL 10 MG PO TABS
10.0000 mg | ORAL_TABLET | Freq: Once | ORAL | Status: DC
  Filled 2021-03-31 (×2): qty 1

## 2021-03-31 MED ORDER — OXYCODONE HCL 5 MG PO TABS: 5.0000 mg | ORAL_TABLET | ORAL | Status: DC | PRN

## 2021-03-31 MED ORDER — SODIUM CHLORIDE 0.9 % IV SOLN
2.0000 g | Freq: Once | INTRAVENOUS | Status: AC
  Administered 2021-03-31: 2 g via INTRAVENOUS
  Filled 2021-03-31: qty 2000

## 2021-03-31 MED ORDER — OXYCODONE-ACETAMINOPHEN 5-325 MG PO TABS: 2.0000 | ORAL_TABLET | ORAL | Status: DC | PRN

## 2021-03-31 MED ORDER — TERBUTALINE SULFATE 1 MG/ML IJ SOLN
INTRAMUSCULAR | Status: AC
  Filled 2021-03-31: qty 1

## 2021-03-31 MED ORDER — LACTATED RINGERS IV SOLN: 500.0000 mL | Freq: Once | INTRAVENOUS | Status: DC

## 2021-03-31 NOTE — Anesthesia Preprocedure Evaluation (Addendum)
Anesthesia Evaluation  Patient identified by MRN, date of birth, ID band Patient awake    Reviewed: Allergy & Precautions, NPO status , Patient's Chart, lab work & pertinent test results  History of Anesthesia Complications Negative for: history of anesthetic complications  Airway Mallampati: II  TM Distance: >3 FB Neck ROM: Full    Dental no notable dental hx.    Pulmonary asthma , former smoker,    Pulmonary exam normal        Cardiovascular negative cardio ROS Normal cardiovascular exam     Neuro/Psych negative neurological ROS  negative psych ROS   GI/Hepatic negative GI ROS, Neg liver ROS,   Endo/Other  negative endocrine ROS  Renal/GU negative Renal ROS  negative genitourinary   Musculoskeletal negative musculoskeletal ROS (+)   Abdominal   Peds  Hematology  (+) anemia , Hgb 9.8   Anesthesia Other Findings Day of surgery medications reviewed with patient.  Reproductive/Obstetrics PPD#1                            Anesthesia Physical Anesthesia Plan  ASA: II  Anesthesia Plan: Spinal   Post-op Pain Management:    Induction:   PONV Risk Score and Plan: 3 and Treatment may vary due to age or medical condition, Ondansetron and Dexamethasone  Airway Management Planned: Natural Airway  Additional Equipment: None  Intra-op Plan:   Post-operative Plan:   Informed Consent: I have reviewed the patients History and Physical, chart, labs and discussed the procedure including the risks, benefits and alternatives for the proposed anesthesia with the patient or authorized representative who has indicated his/her understanding and acceptance.       Plan Discussed with: CRNA  Anesthesia Plan Comments:        Anesthesia Quick Evaluation

## 2021-03-31 NOTE — Lactation Note (Signed)
This note was copied from a baby's chart. Lactation Consultation Note  Patient Name: Alyssa Mason [id number] Date: 03/31/2021 Reason for consult: L&D Initial assessment;Term Age: 35 mins of life  Baby STS on moms chest , noted to be fussy and stuffy at 1st .  Bulb syringe x 1 clear secretions.  After 1st attempting and then baby latched on the left breast / laid back /  Left breast with depth and baby fed for 12 mins with a few swallows.  After feeding STS on moms chest .  Its been 9 years since mom breast fed. LC reviewed the basics of of the early breast feeding. Reassured mom baby has time to become more consistent with latching and the positive is baby latched for 12 mins.  Mom voiced she may end up  mostly  Pumping  and bottle so she can see what the baby is getting for a feeding.  Latch score 7   Maternal Data Has patient been taught Hand Expression?: Yes Does the patient have breastfeeding experience prior to this delivery?: Yes How long did the patient breastfeed?: per om breast fed 3-4 months Per mom + breast changes with this pregnancy.  Feeding Mother's Current Feeding Choice: Breast Milk  LATCH Score Latch: Repeated attempts needed to sustain latch, nipple held in mouth throughout feeding, stimulation needed to elicit sucking reflex.  Audible Swallowing: A few with stimulation  Type of Nipple: Everted at rest and after stimulation  Comfort (Breast/Nipple): Soft / non-tender  Hold (Positioning): Assistance needed to correctly position infant at breast and maintain latch.  LATCH Score: 7   Lactation Tools Discussed/Used    Interventions Interventions: Breast feeding basics reviewed;Assisted with latch;Skin to skin;Breast massage;Hand express;Support pillows;Breast compression;Education  Discharge    Consult Status Consult Status: Follow-up Date: 03/31/21 Follow-up type: In-patient    Matilde Sprang Dejan Angert 03/31/2021, 10:12 AM

## 2021-03-31 NOTE — Anesthesia Preprocedure Evaluation (Signed)
Anesthesia Evaluation  Patient identified by MRN, date of birth, ID band  Reviewed: Allergy & Precautions, Patient's Chart, lab work & pertinent test results  History of Anesthesia Complications Negative for: history of anesthetic complications  Airway Mallampati: II  TM Distance: >3 FB Neck ROM: Full    Dental no notable dental hx.    Pulmonary asthma , former smoker,    Pulmonary exam normal        Cardiovascular negative cardio ROS Normal cardiovascular exam     Neuro/Psych negative neurological ROS  negative psych ROS   GI/Hepatic negative GI ROS, Neg liver ROS,   Endo/Other  BMI 39  Renal/GU negative Renal ROS  negative genitourinary   Musculoskeletal negative musculoskeletal ROS (+)   Abdominal   Peds  Hematology  (+) anemia ,   Anesthesia Other Findings Day of surgery medications reviewed with patient.  Reproductive/Obstetrics (+) Pregnancy                             Anesthesia Physical Anesthesia Plan  ASA: II  Anesthesia Plan: Epidural   Post-op Pain Management:    Induction:   PONV Risk Score and Plan: Treatment may vary due to age or medical condition  Airway Management Planned: Natural Airway  Additional Equipment:   Intra-op Plan:   Post-operative Plan:   Informed Consent: I have reviewed the patients History and Physical, chart, labs and discussed the procedure including the risks, benefits and alternatives for the proposed anesthesia with the patient or authorized representative who has indicated his/her understanding and acceptance.       Plan Discussed with:   Anesthesia Plan Comments:         Anesthesia Quick Evaluation

## 2021-03-31 NOTE — Discharge Summary (Signed)
Postpartum Discharge Summary    Patient Name: Alyssa Mason DOB: July 05, 1986 MRN: [id number]  Date of admission: 03/31/2021 Delivery date:03/31/2021  Delivering provider: Arrie Senate  Date of discharge: 04/02/2021  Admitting diagnosis: Supervision of other normal pregnancy, antepartum [Z34.80] Intrauterine pregnancy: [redacted]w[redacted]d    Secondary diagnosis:  Principal Problem:   Vacuum-assisted vaginal delivery Active Problems:   Tubal ligation evaluation   Anemia in pregnancy   Supervision of other normal pregnancy, antepartum  Additional problems: as noted above  Discharge diagnosis: Term Pregnancy Delivered                                          Post partum procedures:postpartum tubal ligation Augmentation: AROM Complications: None  Hospital course: Onset of Labor With Vaginal Delivery      35y.o. yo G3P1011 at 323w4das admitted in Active Labor on 03/31/2021. Patient had an uncomplicated labor course as follows:  Membrane Rupture Time/Date: 8:29 AM ,03/31/2021   Delivery Method:Vaginal, Vacuum (Extractor)  Episiotomy: None  Lacerations:  None  Patient had an uncomplicated postpartum course. Postpartum BTL was performed. She is ambulating, tolerating a regular diet, passing flatus, and urinating well. Patient is discharged home in stable condition on 04/02/21.  Newborn Data: Birth date:03/31/2021  Birth time:9:01 AM  Gender:Female  Living status:Living  Apgars:7 ,9  Weight:3365 g   Magnesium Sulfate received: No BMZ received: No Rhophylac:N/A MMR:N/A T-DaP:Given prenatally Flu: Yes Transfusion:No  Physical exam  Vitals:   04/01/21 1630 04/01/21 2029 04/02/21 0002 04/02/21 0431  BP: 109/62 124/69 132/82 127/89  Pulse: 87 84 88 83  Resp: _0 Temp: 98.8 F (37.1 C) 98.5 F (36.9 C) 98.5 F (36.9 C) 98 F (36.7 C)  TempSrc: Axillary Axillary Oral Oral  SpO2: 99% 98% 98% 98%  Weight:      Height:       General: alert, cooperative and no  distress Lochia: appropriate Uterine Fundus: firm Incision: Dressing is clean, dry, and intact DVT Evaluation: No evidence of DVT seen on physical exam. No cords or calf tenderness. No significant calf/ankle edema. Labs: Lab Results  Component Value Date   WBC 19.6 (H) 03/31/2021   HGB 9.8 (L) 03/31/2021   HCT 32.9 (L) 03/31/2021   MCV 80.0 03/31/2021   PLT 360 03/31/2021   CMP Latest Ref Rng & Units 12/01/2018  Glucose 70 - 99 mg/dL 97  BUN 6 - 20 mg/dL 12  Creatinine 0.44 - 1.00 mg/dL 0.64  Sodium 135 - 145 mmol/L 138  Potassium 3.5 - 5.1 mmol/L 3.4(L)  Chloride 98 - 111 mmol/L 106  CO2 22 - 32 mmol/L 25  Calcium 8.9 - 10.3 mg/dL 9.1  Total Protein 6.5 - 8.1 g/dL -  Total Bilirubin 0.3 - 1.2 mg/dL -  Alkaline Phos 38 - 126 U/L -  AST 15 - 41 U/L -  ALT 14 - 54 U/L -   Edinburgh Score: Edinburgh Postnatal Depression Scale Screening Tool 03/31/2021  I have been able to laugh and see the funny side of things. 0  I have looked forward with enjoyment to things. 0  I have blamed myself unnecessarily when things went wrong. 0  I have been anxious or worried for no good reason. 0  I have felt scared or panicky for no good reason. 0  Things have been getting on top of me.  0  I have been so unhappy that I have had difficulty sleeping. 0  I have felt sad or miserable. 0  I have been so unhappy that I have been crying. 0  The thought of harming myself has occurred to me. 0  Edinburgh Postnatal Depression Scale Total 0     After visit meds:  Allergies as of 04/02/2021      Reactions   Banana Itching   Watermelon Concentrate [citrullus Vulgaris] Itching      Medication List    STOP taking these medications   ferrous sulfate 325 (65 FE) MG tablet     TAKE these medications   acetaminophen 325 MG tablet Commonly known as: Tylenol Take 2 tablets (650 mg total) by mouth every 6 (six) hours as needed for mild pain, moderate pain, fever or headache (for pain scale < 4).    albuterol (2.5 MG/3ML) 0.083% nebulizer solution Commonly known as: PROVENTIL inhale contents of 1 in nebulizer every 6 hours if needed   albuterol 108 (90 Base) MCG/ACT inhaler Commonly known as: VENTOLIN HFA Inhale 2 puffs into the lungs every 4 (four) hours as needed for wheezing.   cetirizine 10 MG tablet Commonly known as: ZYRTEC Take 10 mg by mouth as needed.   coconut oil Oil Apply 1 application topically as needed (nipple pain).   ibuprofen 600 MG tablet Commonly known as: ADVIL Take 1 tablet (600 mg total) by mouth every 6 (six) hours.   NIFEdipine 30 MG 24 hr tablet Commonly known as: ADALAT CC Take 1 tablet (30 mg total) by mouth daily. Start taking on: Apr 03, 2021   PRENATAL VITAMIN PO Take by mouth.        Discharge home in stable condition Infant Feeding: Breast Infant Disposition:home with mother Discharge instruction: per After Visit Summary and Postpartum booklet. Activity: Advance as tolerated. Pelvic rest for 6 weeks.  Diet: routine diet Future Appointments: Future Appointments  Date Time Provider [place]  05/05/2021 10:30 AM Janyth Pupa, DO [facility]   Follow up Visit:  Please schedule this patient for a In person postpartum visit in 4 weeks with the following provider: Any provider. Additional Postpartum F/U:none  Low risk pregnancy complicated by: none Delivery mode:  Vaginal, Vacuum Neurosurgeon)  Anticipated Birth Control:  BTL done PP  Marionna Gonia, Gildardo Cranker, MD OB Fellow, Faculty Practice 04/02/2021 8:37 AM

## 2021-03-31 NOTE — H&P (Signed)
OBSTETRIC ADMISSION HISTORY AND PHYSICAL  Alyssa Mason is a 35 y.o. female G3P1011 with IUP at [redacted]w[redacted]d by 8 week ultrasond presenting for spontaneous onset of labor. She reports +FMs, No LOF, no VB, no blurry vision, headaches or peripheral edema, and RUQ pain.  She plans on breast feeding. She requests BTL for birth control. She received her prenatal care at [facility] ([place])   Dating: By 8 week ultrasound --->  Estimated Date of Delivery: 04/03/21  Sono:  @[redacted]w[redacted]d , CWD, normal anatomy, breech presentation, 247g, 71% EFW   Prenatal History/Complications:  - history of vacuum-assisted vaginal delivery  Past Medical History: Past Medical History:  Diagnosis Date  . Anemia   . Asthma    prn albuterol  . Chronic kidney disease    kidney stones  . H/O pyelonephritis   . H/O varicella   . History of kidney stones     Past Surgical History: Past Surgical History:  Procedure Laterality Date  . NO PAST SURGERIES      Obstetrical History: OB History    Gravida  3   Para  1   Term  1   Preterm  0   AB  1   Living  1     SAB  1   IAB  0   Ectopic  0   Multiple  0   Live Births  1           Social History Social History   Socioeconomic History  . Marital status: Married    Spouse name: Not on file  . Number of children: Not on file  . Years of education: Not on file  . Highest education level: Not on file  Occupational History  . Not on file  Tobacco Use  . Smoking status: Former Smoker    Packs/day: 0.50    Years: 10.00    Pack years: 5.00    Types: Cigarettes  . Smokeless tobacco: Never Used  Vaping Use  . Vaping Use: Never used  Substance and Sexual Activity  . Alcohol use: No  . Drug use: No  . Sexual activity: Yes    Birth control/protection: None  Other Topics Concern  . Not on file  Social History Narrative  . Not on file   Social Determinants of Health   Financial Resource Strain: Low Risk   . Difficulty of Paying Living Expenses: Not  hard at all  Food Insecurity: No Food Insecurity  . Worried About in the Last Year: Never true  . Ran Out of Food in the Last Year: Never true  Transportation Needs: No Transportation Needs  . Lack of Transportation (Medical): No  . Lack of Transportation (Non-Medical): No  Physical Activity: Inactive  . Days of Exercise per Week: 0 days  . Minutes of Exercise per Session: 0 min  Stress: No Stress Concern Present  . Feeling of Stress : Not at all  Social Connections: Socially Integrated  . Frequency of Communication with Friends and Family: More than three times a week  . Frequency of Social Gatherings with Friends and Family: Three times a week  . Attends Religious Services: 1 to 4 times per year  . Active Member of Clubs or Organizations: No  . Attends Programme researcher, broadcasting/film/video Meetings: 1 to 4 times per year  . Marital Status: Married    Family History: Family History  Problem Relation Age of Onset  . COPD Mother   . Heart disease Father  heart murmur  . Hypertension Father   . Thyroid disease Father   . Rheum arthritis Father   . Thyroid disease Maternal Grandmother   . Colon cancer Maternal Grandmother   . Rheum arthritis Paternal Grandmother   . Heart disease Paternal Grandfather   . Cancer Paternal Grandfather        lung    Allergies: Allergies  Allergen Reactions  . Banana Itching  . Watermelon Concentrate [Citrullus Vulgaris] Itching    Medications Prior to Admission  Medication Sig Dispense Refill Last Dose  . cetirizine (ZYRTEC) 10 MG tablet Take 10 mg by mouth as needed.   Past Week at Unknown time  . ferrous sulfate 325 (65 FE) MG tablet Take 1 tablet (325 mg total) by mouth every other day. 45 tablet 2 Past Week at Unknown time  . Prenatal Vit-Fe Fumarate-FA (PRENATAL VITAMIN PO) Take by mouth.   03/30/2021 at Unknown time  . albuterol (PROVENTIL HFA;VENTOLIN HFA) 108 (90 Base) MCG/ACT inhaler Inhale 2 puffs into the lungs every 4  (four) hours as needed for wheezing. 1 Inhaler 2 More than a month at Unknown time  . albuterol (PROVENTIL) (2.5 MG/3ML) 0.083% nebulizer solution inhale contents of 1 in nebulizer every 6 hours if needed 300 mL 0      Review of Systems   All systems reviewed and negative except as stated in HPI  Blood pressure (!) 132/57, pulse 96, temperature 98.9 F (37.2 C), temperature source Oral, resp. rate 20, height 5\' 1"  (1.549 m), weight 93.6 kg, SpO2 99 %, unknown if currently breastfeeding. General appearance: alert, cooperative and appears stated age Lungs: normal WOB Heart: regular rate  Abdomen: soft, non-tender Extremities: no sign of DVT Presentation: cephalic Fetal monitoringBaseline: 140 bpm, Variability: Good {> 6 bpm), Accelerations: Reactive and Decelerations: Absent Uterine activityFrequency: Every 2-5 minutes Dilation: 10 Effacement (%): 80 Station: Plus 2,Plus 3 Exam by:: [email or web address], CNM   Prenatal labs: ABO, Rh: --/--/A POS (04/29 0700) Antibody: NEG (04/29 0700) Rubella: 2.01 (10/19 1623) RPR: Non Reactive (01/25 0827)  HBsAg: Negative (10/19 1623)  HIV: Non Reactive (01/25 0827)  GBS: Positive/-- (04/11 1104)  2 hr Glucola wnl Genetic screening  wnl Anatomy 01-29-1973 wnl  Prenatal Transfer Tool  Maternal Diabetes: No Genetic Screening: Normal Maternal Ultrasounds/Referrals: Normal Fetal Ultrasounds or other Referrals:  None Maternal Substance Abuse:  No Significant Maternal Medications:  None Significant Maternal Lab Results: Group B Strep positive  Results for orders placed or performed during the hospital encounter of 03/31/21 (from the past 24 hour(s))  Resp Panel by RT-PCR (Flu A&B, Covid) Nasopharyngeal Swab   Collection Time: 03/31/21  6:50 AM   Specimen: Nasopharyngeal Swab; Nasopharyngeal(NP) swabs in vial transport medium  Result Value Ref Range   SARS Coronavirus 2 by RT PCR NEGATIVE NEGATIVE   Influenza A by PCR NEGATIVE NEGATIVE   Influenza B  by PCR NEGATIVE NEGATIVE  CBC   Collection Time: 03/31/21  7:00 AM  Result Value Ref Range   WBC 19.6 (H) 4.0 - 10.5 K/uL   RBC 4.11 3.87 - 5.11 MIL/uL   Hemoglobin 9.8 (L) 12.0 - 15.0 g/dL   HCT 04/02/21 (L) 03.4 - 74.2 %   MCV 80.0 80.0 - 100.0 fL   MCH 23.8 (L) 26.0 - 34.0 pg   MCHC 29.8 (L) 30.0 - 36.0 g/dL   RDW 59.5 (H) 63.8 - 75.6 %   Platelets 360 150 - 400 K/uL   nRBC 0.0 0.0 - 0.2 %  Type and screen [facility]   Collection Time: 03/31/21  7:00 AM  Result Value Ref Range   ABO/RH(D) A POS    Antibody Screen NEG    Sample Expiration      04/03/2021,2359 Performed at [facility], [address] [place], [place] [address]     Patient Active Problem List   Diagnosis Date Noted  . Supervision of other normal pregnancy, antepartum 03/31/2021  . Anemia in pregnancy 03/01/2021  . Encounter for supervision of normal pregnancy, antepartum 09/20/2020  . Heart palpitations 09/19/2016    Assessment/Plan:  Alyssa Mason is a 35 y.o. G3P1011 at [redacted]w[redacted]d here for spontaneous onset of labor.  #Labor: Will manage expectantly and augment as clinically indicated. #Pain: TBD per pt request #FWB: Category 1 strip #ID: GBS+ >amp on admission #MOF: breast #MOC: desires postpartum BTL #Circ: desired  Goswick, Skipper Cliche, MD OB Fellow, Faculty Practice 03/31/2021 9:45 AM   Attestation of Attending Supervision of Obstetric Fellow: Evaluation and management procedures were performed by the Obstetric Fellow under my supervision and collaboration.  I have reviewed the Obstetric Fellow's note and chart, and I agree with the management and plan. I have also made any necessary editorial changes.   Jaynie Collins, MD, FACOG Attending Obstetrician & Gynecologist, [facility] for [employer], [facility]

## 2021-03-31 NOTE — Lactation Note (Signed)
This note was copied from a baby's chart. Lactation Consultation Note  Patient Name: Alyssa Mason [id number] Date: 03/31/2021 Reason for consult: Initial assessment Age:35 hours  Mother is a P2, Mother reports breastfeeding her now 16 yr old for a few weeks. Mother reports that this infant has breastfeed only in laid back position. She desires to do this again at this feeding.  Infant is 5 hours old and 39.4 weeks.   Mother was given [facility] brochure and basic teaching done.   Reviewed hand expression with mother. Observed tiny drops of colostrum.   Infant placed in mothers arms on the  Left breast. Infant latched quickly, sustaining latch for 15-20 mins.   Mother was observed with infant latched on at the left breast. Observed infant suckling with audible swallows. Infant sustained latch for 20 mins.    Mother to continue to cue base feed infant and feed at least 8-12 times or more in 24 hours and advised to allow for cluster feeding infant as needed.  Mother to continue to due STS. Mother is aware of available LC services at [facility], BFSG'S, OP Dept, and phone # for questions or concerns about breastfeeding.  Mother receptive to all teaching and plan of care.    Maternal Data    Feeding Mother's Current Feeding Choice: Breast Milk  LATCH Score Latch: Grasps breast easily, tongue down, lips flanged, rhythmical sucking.  Audible Swallowing: A few with stimulation  Type of Nipple: Everted at rest and after stimulation  Comfort (Breast/Nipple): Soft / non-tender  Hold (Positioning): Assistance needed to correctly position infant at breast and maintain latch.  LATCH Score: 8   Lactation Tools Discussed/Used    Interventions Interventions: Breast feeding basics reviewed;Assisted with latch;Skin to skin;Hand express;Adjust position;Support pillows;Position options  Discharge    Consult Status Consult Status: Follow-up Date: 04/01/21 Follow-up type: In-patient    Stevan Born [facility] 03/31/2021, 3:41 PM

## 2021-03-31 NOTE — Progress Notes (Signed)
    Faculty Practice OB/GYN Attending Postpartum Sterilization Counseling Note  35 y.o. [id number] s/p recent VAVD at [redacted]w[redacted]d who desires permanent sterilization.   Other reversible forms of contraception including the most effective LARCs such as IUD or Nexplanon were discussed with patient; she declines all other modalities. Her FOB also declined vasectomy, after being counseled about this procedure being less invasive and more effective.  Patient was also given the option of an interval laparoscopic tubal ligation or bilateral salpingectomy which slightly increases the efficacy and is less invasive but she declined.  Details of postpartum tubal sterilization discussed in detail.   She was told that this will be performed as either salpingectomy or occlusion with Filshie clips, depending on difficulty of procedure, exposure and other factors.  Risks of procedure discussed with patient including but not limited to: risk of regret, permanence of method, bleeding, infection, injury to surrounding organs and need for additional procedures.  Failure risk of about 1 % with increased risk of ectopic gestation if pregnancy occurs was also discussed with patient.   Also discussed possibility of post-tubal syndrome with increased pelvic pain or menstrual irregularities. Patient verbalized understanding of these risks and wants to proceed with sterilization.  Written informed consent obtained.  Procedure has been scheduled for 1440 today.  Patient understands that her procedure may be delayed by any cases coming from L&D or MAU, or any other urgent events in [facility] as this is an elective procedure.    NPO and other preoperative orders placed.  Will continue close observation and postpartum care as ordered. To OR when ready.    Jaynie Collins, MD, FACOG Obstetrician & Gynecologist, [facility] for [employer], [facility]

## 2021-03-31 NOTE — MAU Note (Signed)
PT SAYS  SHE WAS IN FAMILY TREE ON Thursday- 2-3 CM - STRIPPED MEMBRANES .  UC STRONG -0400.  DENIES HSV AND MRSA. GBS- POSITIVE

## 2021-03-31 NOTE — Anesthesia Procedure Notes (Signed)
Epidural Patient location during procedure: OB Start time: 03/31/2021 7:59 AM End time: 03/31/2021 8:02 AM  Staffing Anesthesiologist: Kaylyn Layer, MD Performed: anesthesiologist   Preanesthetic Checklist Completed: patient identified, IV checked, risks and benefits discussed, monitors and equipment checked, pre-op evaluation and timeout performed  Epidural Patient position: sitting Prep: DuraPrep and site prepped and draped Patient monitoring: continuous pulse ox, blood pressure and heart rate Approach: midline Location: L3-L4 Injection technique: LOR air  Needle:  Needle type: Tuohy  Needle gauge: 17 G Needle length: 9 cm Needle insertion depth: 6 cm Catheter type: closed end flexible Catheter size: 19 Gauge Catheter at skin depth: 11 cm Test dose: negative and Other (1% lidocaine)  Assessment Events: blood not aspirated, injection not painful, no injection resistance, no paresthesia and negative IV test  Additional Notes Patient identified. Risks, benefits, and alternatives discussed with patient including but not limited to bleeding, infection, nerve damage, paralysis, failed block, incomplete pain control, headache, blood pressure changes, nausea, vomiting, reactions to medication, itching, and postpartum back pain. Confirmed with bedside nurse the patient's most recent platelet count. Confirmed with patient that they are not currently taking any anticoagulation, have any bleeding history, or any family history of bleeding disorders. Patient expressed understanding and wished to proceed. All questions were answered. Sterile technique was used throughout the entire procedure. Please see nursing notes for vital signs.   Crisp LOR on first pass. Test dose was given through epidural catheter and negative prior to continuing to dose epidural or start infusion. Warning signs of high block given to the patient including shortness of breath, tingling/numbness in hands, complete  motor block, or any concerning symptoms with instructions to call for help. Patient was given instructions on fall risk and not to get out of bed. All questions and concerns addressed with instructions to call with any issues or inadequate analgesia.  Reason for block:procedure for pain

## 2021-03-31 NOTE — Progress Notes (Signed)
Alyssa Mason is a 35 y.o. G3P1011 at [redacted]w[redacted]d by 8 week ultrasound admitted for active labor  Subjective: Pt with epidural, but feeling significant pelvic/rectal pressure with contractions.  Husband at bedside for support.   Objective: BP (!) 132/57   Pulse 96   Temp 98.9 F (37.2 C) (Oral)   Resp 20   Ht 5\' 1"  (1.549 m)   Wt 93.6 kg   LMP  (LMP Unknown)   SpO2 99%   BMI 38.98 kg/m  No intake/output data recorded. No intake/output data recorded.  FHT:  FHR: 145 bpm, variability: moderate,  accelerations:  Abscent,  decelerations:  Present onset of lates with 9-10 cm dilation, improved but not resolved with position change, IV fluids UC:   regular, every 2 minutes SVE:   Dilation: 10 Effacement (%): 80 Station: Plus 2,Plus 3 Exam by:: [email or web address], CNM AROM with light meconium. Pt tolerated well.  Labs: Lab Results  Component Value Date   WBC 19.6 (H) 03/31/2021   HGB 9.8 (L) 03/31/2021   HCT 32.9 (L) 03/31/2021   MCV 80.0 03/31/2021   PLT 360 03/31/2021    Assessment / Plan: G3P1011 at [redacted]w[redacted]d with spontaneous onset of labor, onset of lates on FHR monitor at 9 cm dilation, Dr [redacted]w[redacted]d to room. With pushing, lates persisted with less recovery to baseline and delivery not imminent so Dr Macon Large discussed and consented pt for use of vacuum.    Labor: Delayed consent with nonreassuring fetal heart rate, Drs Macon Large and Macon Large to room. Preeclampsia:  n/a Fetal Wellbeing:  Category II Pain Control:  Epidural I/D:  GBS positive, Ampicillin 1 dose given Anticipated MOD:  VAVD  [name] 03/31/2021, 9:20 AM

## 2021-03-31 NOTE — Anesthesia Postprocedure Evaluation (Signed)
Anesthesia Post Note  Patient: Alyssa Mason  Procedure(s) Performed: AN AD HOC LABOR EPIDURAL     Patient location during evaluation: Mother Baby Anesthesia Type: Epidural Level of consciousness: awake, awake and alert and oriented Pain management: pain level controlled Vital Signs Assessment: post-procedure vital signs reviewed and stable Respiratory status: spontaneous breathing Cardiovascular status: blood pressure returned to baseline Postop Assessment: no headache, no backache, adequate PO intake, able to ambulate and no apparent nausea or vomiting Anesthetic complications: no Comments: Pt states she delivered quickly after epidural placement and didn't get great pain relief.    No complications documented.  Last Vitals:  Vitals:   03/31/21 1215 03/31/21 1800  BP: 131/78 134/71  Pulse: 92 100  Resp: 18 18  Temp: 36.6 C 36.7 C  SpO2: 100% 99%    Last Pain:  Vitals:   03/31/21 1830  TempSrc:   PainSc: 5    Pain Goal: Patients Stated Pain Goal: 4 (03/31/21 0740)                 Jennelle Human

## 2021-04-01 ENCOUNTER — Encounter (HOSPITAL_COMMUNITY): Payer: Self-pay | Admitting: Obstetrics and Gynecology

## 2021-04-01 ENCOUNTER — Inpatient Hospital Stay (HOSPITAL_COMMUNITY): Payer: BC Managed Care – PPO | Admitting: Anesthesiology

## 2021-04-01 ENCOUNTER — Encounter (HOSPITAL_COMMUNITY)
Admission: AD | Disposition: A | Discharge status: Home / Self Care | Payer: Self-pay | Source: Home / Self Care | Attending: Obstetrics & Gynecology

## 2021-04-01 DIAGNOSIS — Z302 Encounter for sterilization: Secondary | ICD-10-CM

## 2021-04-01 HISTORY — PX: TUBAL LIGATION: SHX77

## 2021-04-01 SURGERY — LIGATION, FALLOPIAN TUBE, POSTPARTUM
Anesthesia: Spinal

## 2021-04-01 MED ORDER — BUPIVACAINE HCL (PF) 0.5 % IJ SOLN
INTRAMUSCULAR | Status: DC | PRN
  Administered 2021-04-01: 30 mL

## 2021-04-01 MED ORDER — LACTATED RINGERS IV SOLN: INTRAVENOUS | Status: DC | PRN

## 2021-04-01 MED ORDER — ONDANSETRON HCL 4 MG/2ML IJ SOLN
INTRAMUSCULAR | Status: DC | PRN
  Administered 2021-04-01: 4 mg via INTRAVENOUS

## 2021-04-01 MED ORDER — MIDAZOLAM HCL 5 MG/5ML IJ SOLN
INTRAMUSCULAR | Status: DC | PRN
  Administered 2021-04-01: 2 mg via INTRAVENOUS

## 2021-04-01 MED ORDER — KETOROLAC TROMETHAMINE 30 MG/ML IJ SOLN
INTRAMUSCULAR | Status: AC
  Filled 2021-04-01: qty 1

## 2021-04-01 MED ORDER — MIDAZOLAM HCL 2 MG/2ML IJ SOLN
INTRAMUSCULAR | Status: AC
  Filled 2021-04-01: qty 2

## 2021-04-01 MED ORDER — FENTANYL CITRATE (PF) 100 MCG/2ML IJ SOLN: 25.0000 ug | INTRAMUSCULAR | Status: DC | PRN

## 2021-04-01 MED ORDER — BUPIVACAINE HCL (PF) 0.5 % IJ SOLN
INTRAMUSCULAR | Status: AC
  Filled 2021-04-01: qty 30

## 2021-04-01 MED ORDER — ONDANSETRON HCL 4 MG/2ML IJ SOLN
INTRAMUSCULAR | Status: AC
  Filled 2021-04-01: qty 2

## 2021-04-01 MED ORDER — LACTATED RINGERS IV SOLN: INTRAVENOUS | Status: DC

## 2021-04-01 MED ORDER — ACETAMINOPHEN 500 MG PO TABS
1000.0000 mg | ORAL_TABLET | Freq: Once | ORAL | Status: AC
  Administered 2021-04-01: 1000 mg via ORAL

## 2021-04-01 MED ORDER — OXYCODONE HCL 5 MG PO TABS: 5.0000 mg | ORAL_TABLET | ORAL | Status: DC | PRN

## 2021-04-01 MED ORDER — FENTANYL CITRATE (PF) 100 MCG/2ML IJ SOLN
INTRAMUSCULAR | Status: DC | PRN
  Administered 2021-04-01: 85 ug via INTRAVENOUS

## 2021-04-01 MED ORDER — NIFEDIPINE ER OSMOTIC RELEASE 30 MG PO TB24
30.0000 mg | ORAL_TABLET | Freq: Every day | ORAL | Status: DC
  Administered 2021-04-02: 30 mg via ORAL
  Filled 2021-04-01 (×2): qty 1

## 2021-04-01 MED ORDER — ACETAMINOPHEN 500 MG PO TABS
ORAL_TABLET | ORAL | Status: AC
  Filled 2021-04-01: qty 2

## 2021-04-01 MED ORDER — FENTANYL CITRATE (PF) 100 MCG/2ML IJ SOLN
INTRAMUSCULAR | Status: AC
  Filled 2021-04-01: qty 2

## 2021-04-01 MED ORDER — ACETAMINOPHEN 160 MG/5ML PO SOLN: 1000.0000 mg | Freq: Once | ORAL | Status: AC

## 2021-04-01 MED ORDER — METOCLOPRAMIDE HCL 10 MG PO TABS
10.0000 mg | ORAL_TABLET | Freq: Once | ORAL | Status: AC
  Administered 2021-04-01: 10 mg via ORAL

## 2021-04-01 MED ORDER — FAMOTIDINE 20 MG PO TABS
40.0000 mg | ORAL_TABLET | Freq: Once | ORAL | Status: AC
  Administered 2021-04-01: 40 mg via ORAL
  Filled 2021-04-01: qty 2

## 2021-04-01 MED ORDER — KETOROLAC TROMETHAMINE 30 MG/ML IJ SOLN
30.0000 mg | Freq: Once | INTRAMUSCULAR | Status: AC
  Administered 2021-04-01: 30 mg via INTRAVENOUS

## 2021-04-01 MED ORDER — PROMETHAZINE HCL 25 MG/ML IJ SOLN: 6.2500 mg | INTRAMUSCULAR | Status: DC | PRN

## 2021-04-01 MED ORDER — FENTANYL CITRATE (PF) 100 MCG/2ML IJ SOLN
INTRAMUSCULAR | Status: DC | PRN
  Administered 2021-04-01: 15 ug via INTRATHECAL

## 2021-04-01 SURGICAL SUPPLY — 28 items
APL SKNCLS STERI-STRIP NONHPOA (GAUZE/BANDAGES/DRESSINGS)
BENZOIN TINCTURE PRP APPL 2/3 (GAUZE/BANDAGES/DRESSINGS) IMPLANT
BLADE SURG 11 STRL SS (BLADE) ×2 IMPLANT
CLOTH BEACON ORANGE TIMEOUT ST (SAFETY) ×2 IMPLANT
DRSG OPSITE 4X5.5 SM (GAUZE/BANDAGES/DRESSINGS) ×1 IMPLANT
DRSG OPSITE POSTOP 3X4 (GAUZE/BANDAGES/DRESSINGS) ×2 IMPLANT
DURAPREP 26ML APPLICATOR (WOUND CARE) ×2 IMPLANT
ELECT REM PT RETURN 9FT ADLT (ELECTROSURGICAL) ×2
ELECTRODE REM PT RTRN 9FT ADLT (ELECTROSURGICAL) ×1 IMPLANT
GLOVE BIOGEL PI IND STRL 7.0 (GLOVE) ×3 IMPLANT
GLOVE BIOGEL PI INDICATOR 7.0 (GLOVE) ×3
GLOVE ECLIPSE 7.0 STRL STRAW (GLOVE) ×2 IMPLANT
GOWN STRL REUS W/TWL LRG LVL3 (GOWN DISPOSABLE) ×4 IMPLANT
NEEDLE HYPO 22GX1.5 SAFETY (NEEDLE) ×2 IMPLANT
NS IRRIG 1000ML POUR BTL (IV SOLUTION) ×2 IMPLANT
PACK ABDOMINAL MINOR (CUSTOM PROCEDURE TRAY) ×2 IMPLANT
PENCIL BUTTON HOLSTER BLD 10FT (ELECTRODE) ×2 IMPLANT
PROTECTOR NERVE ULNAR (MISCELLANEOUS) ×2 IMPLANT
SPONGE LAP 4X18 RFD (DISPOSABLE) IMPLANT
SUT MON AB 4-0 PS1 27 (SUTURE) ×1 IMPLANT
SUT PLAIN 0 NONE (SUTURE) ×2 IMPLANT
SUT VIC AB 0 CT1 27 (SUTURE) ×2
SUT VIC AB 0 CT1 27XBRD ANBCTR (SUTURE) ×1 IMPLANT
SUT VICRYL 4-0 PS2 18IN ABS (SUTURE) ×2 IMPLANT
SYR CONTROL 10ML LL (SYRINGE) ×2 IMPLANT
TOWEL OR 17X24 6PK STRL BLUE (TOWEL DISPOSABLE) ×4 IMPLANT
TRAY FOLEY CATH SILVER 14FR (SET/KITS/TRAYS/PACK) ×2 IMPLANT
WATER STERILE IRR 1000ML POUR (IV SOLUTION) ×2 IMPLANT

## 2021-04-01 NOTE — Transfer of Care (Signed)
Immediate Anesthesia Transfer of Care Note  Patient: DONNICA JARNAGIN  Procedure(s) Performed: POST PARTUM TUBAL LIGATION (N/A )  Patient Location: PACU  Anesthesia Type:Spinal  Level of Consciousness: awake, alert  and oriented  Airway & Oxygen Therapy: Patient Spontanous Breathing  Post-op Assessment: Report given to RN and Post -op Vital signs reviewed and stable  Post vital signs: Reviewed and stable  Last Vitals:  Vitals Value Taken Time  BP    Temp    Pulse 90 04/01/21 1339  Resp    SpO2 99 % 04/01/21 1339  Vitals shown include unvalidated device data.  Last Pain:  Vitals:   04/01/21 1229  TempSrc: Oral  PainSc:       Patients Stated Pain Goal: 4 (03/31/21 0740)  Complications: No complications documented.

## 2021-04-01 NOTE — Anesthesia Postprocedure Evaluation (Signed)
Anesthesia Post Note  Patient: CARLEIGH BUCCIERI  Procedure(s) Performed: POST PARTUM TUBAL LIGATION (N/A )     Patient location during evaluation: PACU Anesthesia Type: Spinal Level of consciousness: awake and alert and oriented Pain management: pain level controlled Vital Signs Assessment: post-procedure vital signs reviewed and stable Respiratory status: spontaneous breathing, nonlabored ventilation and respiratory function stable Cardiovascular status: blood pressure returned to baseline Postop Assessment: no apparent nausea or vomiting, spinal receding, no headache and no backache Anesthetic complications: no   No complications documented.  Last Vitals:  Vitals:   04/01/21 1500 04/01/21 1517  BP:  108/67  Pulse: 80 84  Resp: (!) 21 18  Temp:  36.7 C  SpO2: 100% 100%    Last Pain:  Vitals:   04/01/21 1517  TempSrc: Axillary  PainSc: 0-No pain   Pain Goal: Patients Stated Pain Goal: 4 (03/31/21 0740)  LLE Motor Response: Purposeful movement (04/01/21 1500)   RLE Motor Response: Purposeful movement (04/01/21 1500)       Epidural/Spinal Function Cutaneous sensation: Able to Wiggle Toes (04/01/21 1500), Patient able to flex knees: Yes (04/01/21 1500), Patient able to lift hips off bed: Yes (04/01/21 1500), Back pain beyond tenderness at insertion site: No (04/01/21 1500), Progressively worsening motor and/or sensory loss: No (04/01/21 1500), Bowel and/or bladder incontinence post epidural: No (04/01/21 1500)  Kaylyn Layer

## 2021-04-01 NOTE — Lactation Note (Signed)
This note was copied from a baby's chart. Lactation Consultation Note  Patient Name: Alyssa Mason [id number] Date: 04/01/2021 Reason for consult: Follow-up assessment;Term;Infant weight loss Age:35 hours  Visited with mom of 51 hours old FT female, she's a P2 and started supplementing with Enfamil 20 calorie formula today, mom had to be taken for her surgery. She hasn't been putting baby to breast consistently today. Asked mom to call for latch assistance when needed, she was offering a bottle of formula during [facility] consultation.  Reviewed normal newborn behavior, cluster feeding, feeding cues, size of baby's stomach and lactogenesis II. Parents are anticipating discharge for tomorrow, mom brought her DEBP from home to the hospital.  Feeding plan:  1. Encouraged mom to feed baby STS 8-12 times/24 hours or sooner if feeding cues are present 2. Pumping any time baby is getting a bottle with formula was also strongly encouraged to protect her supply  FOB present at the time of [facility] consultation. Parents reported all questions and concerns were answered, they're both aware of LC OP services and will call PRN.  Maternal Data    Feeding Mother's Current Feeding Choice: Breast Milk and Formula Nipple Type: Slow - flow  Lactation Tools Discussed/Used Tools: Pump Breast pump type: Manual Pump Education: Setup, frequency, and cleaning;Milk Storage Reason for Pumping: mother's request Pumping frequency: whenever baby is getting formula Pumped volume: 5 mL (she was told to pump and dump just one time right after her surgery)  Interventions Interventions: Breast feeding basics reviewed;Hand pump  Discharge Pump: Manual;Personal (DEBP at home (brought it to the hospital))  Consult Status Consult Status: Follow-up Date: 04/02/21 Follow-up type: In-patient    Alyssa Mason Alyssa Mason 04/01/2021, 11:13 PM

## 2021-04-01 NOTE — Op Note (Signed)
Alyssa Mason 04/01/2021  PREOPERATIVE DIAGNOSES: Multiparity, undesired fertility  POSTOPERATIVE DIAGNOSES: Multiparity, undesired fertility  PROCEDURE:  Postpartum Bilateral Salpingectomy   SURGEON: Dr.  Jaynie Collins  ANESTHESIA:  Epidural and local analgesia using 30 ml of 0.5% Marcaine  COMPLICATIONS:  None immediate.  ESTIMATED BLOOD LOSS: 10 ml.  INDICATIONS:  35 y.o. Alyssa Mason with undesired fertility, status post vaginal delivery, desires permanent sterilization.  Other reversible forms of contraception were discussed with patient; she declines all other modalities. Risks of procedure discussed with patient including but not limited to: risk of regret, permanence of method, bleeding, infection, injury to surrounding organs and need for additional procedures.  Discussed failure risk of 1% with increased risk of ectopic gestation if pregnancy occurs.  Also discussed possibility of post-tubal syndrome with increased pelvic pain or menstrual irregularities.  Patient verbalized understanding of these risks and wants to proceed with sterilization.  Written informed consent obtained.     FINDINGS:  Normal uterus, tubes, and ovaries. Excised fallopian tubes were sent to pathology.   PROCEDURE DETAILS: The patient was taken to the operating room where her epidural anesthesia was dosed up to surgical level and found to be adequate.  She was then placed in the dorsal supine position and prepped and draped in sterile fashion.   After an adequate timeout was performed, attention was turned to the patient's abdomen local analgesia was administered.  A small transverse skin incision was made under the umbilical fold. The incision was taken down to the layer of fascia using the scalpel, and fascia was incised, and extended bilaterally using Mayo scissors. The peritoneum was entered in a sharp fashion.  Attention was then turned to the patient's uterus, and left fallopian tube was then identified, and  the Babcock clamp was then used to grasp the tube. Kasondra forceps were placed on the distal portion of the mesosalpinx underneath about 80-90% of the tube.  This pedicle was double suture ligated with 2-0 Monocryl, and this large portion of the tube including the fimbriated end was excised.  The right fallopian tube was then identified, doubly ligated, excised in a similar fashion allowing for bilateral salpingectomy.   Good hemostasis was noted overall.  The instruments were then removed from the patient's abdomen and the fascial incision was repaired with 0 Vicryl, subcutaneous closed with 0 vicryl, and the skin was closed with a 4-0 monocryl subcuticular stitch. The patient tolerated the procedure well.  Instrument, sponge, and needle counts were correct times three.  The patient was then taken to the recovery room awake and in stable condition.    Casper Harrison, MD [facility] Family Medicine Fellow, [facility] for [facility], [facility]

## 2021-04-01 NOTE — Progress Notes (Signed)
OB Attending Patient desires bilateral tubal sterilization.  Other reversible forms of contraception were discussed with patient; she declines all other modalities. Discussed bilateral tubal sterilization in detail; discussed options of  bilateral tubal sterilization using Filshie clips, modified Pomeroy and  bilateral salpingectomy. Risks and benefits discussed in detail including but not limited to: risk of regret, permanence of method, bleeding, infection, injury to surrounding organs and need for additional procedures.  Failure risk of 1-2 % for Filshie clips and <1% for bilateral salpingectomy with increased risk of ectopic gestation if pregnancy occurs was also discussed with patient.  Also discussed possible reduction of risk of ovarian cancer via bilateral salpingectomy given that a growing body of knowledge reveals that the majority of cases of high grade serous "ovarian" cancer actually are actually  cancers arising from the fimbriated end of the fallopian tubes. Emphasized that removal of fallopian tubes do not result in any known hormonal imbalance.  Patient verbalized understanding of these risks and benefits and wants to proceed with sterilization with  bilateral sterilization by bilateral salpingectomy.  Pt to remain NPO. Denies any chronic medical problems or medications. Denies any abd/pelvic surgery  To OR when ready.

## 2021-04-01 NOTE — Anesthesia Procedure Notes (Signed)
Spinal  Patient location during procedure: OR Start time: 04/01/2021 12:41 PM End time: 04/01/2021 12:44 PM Reason for block: surgical anesthesia Staffing Performed: anesthesiologist  Anesthesiologist: Kaylyn Layer, MD Preanesthetic Checklist Completed: patient identified, IV checked, risks and benefits discussed, monitors and equipment checked, pre-op evaluation and timeout performed Spinal Block Patient position: sitting Prep: DuraPrep and site prepped and draped Patient monitoring: heart rate, continuous pulse ox and blood pressure Approach: midline Location: L3-4 Injection technique: single-shot Needle Needle type: Pencan  Needle gauge: 24 G Needle length: 10 cm Assessment Sensory level: T4 Events: CSF return Additional Notes Risks, benefits, and alternative discussed. Patient gave consent to procedure. Prepped and draped in sitting position. Clear CSF obtained after one needle pass. Positive terminal aspiration. No pain or paraesthesias with injection. Patient tolerated procedure well. Vital signs stable. Alyssa Greenhouse, MD

## 2021-04-01 NOTE — Progress Notes (Addendum)
Post Partum Day 1 Subjective: no complaints, up ad lib, voiding, tolerating PO and + flatus  Objective: Blood pressure 130/71, pulse 88, temperature 98 F (36.7 C), temperature source Oral, resp. rate 19, height 5\' 1"  (1.549 m), weight 93.6 kg, SpO2 99 %, unknown if currently breastfeeding.  Physical Exam:  General: alert, cooperative and no distress Lochia: appropriate Uterine Fundus: firm Incision: n/a DVT Evaluation: No evidence of DVT seen on physical exam.  Recent Labs    03/31/21 0700  HGB 9.8*  HCT 32.9*    Assessment/Plan: Patient scheduled for BTL at 1530 and has been NPO since this am. Doing well postpartum, infant needs circ and will be consented later today.   Several blood pressures in 130s/80s. Discussed starting PO antihypertensives and reasons for doing so. Patient agreeable to plan of care. Will start procardia 30XL.  Plan for discharge tomorrow.    LOS: 1 day   04/02/21 CNM 04/01/2021, 10:44 AM

## 2021-04-02 MED ORDER — ACETAMINOPHEN 325 MG PO TABS: 650.0000 mg | ORAL_TABLET | Freq: Four times a day (QID) | ORAL | Status: AC | PRN

## 2021-04-02 MED ORDER — COCONUT OIL OIL: 1.0000 "application " | TOPICAL_OIL | 0 refills | Status: DC | PRN

## 2021-04-02 MED ORDER — IBUPROFEN 600 MG PO TABS: 600.0000 mg | ORAL_TABLET | Freq: Four times a day (QID) | ORAL | 0 refills | Status: DC

## 2021-04-02 MED ORDER — NIFEDIPINE ER 30 MG PO TB24: 30.0000 mg | ORAL_TABLET | Freq: Every day | ORAL | 0 refills | Status: DC

## 2021-04-02 NOTE — Lactation Note (Signed)
This note was copied from a baby's chart. Lactation Consultation Note  Patient Name: Alyssa Mason [id number] Date: 04/02/2021 Reason for consult: Follow-up assessment;Term Age:35 hours   P2 mother whose infant is now 32 hours old.  This is a term baby at 39+4 weeks.  Mother breast fed her first child (now 34 years old) for a few weeks.  Mother has been breast feeding and supplementing with formula.  Mother has not breast fed in over 24 hours; stated she had a lot going on yesterday with her baby's circumcision and her BTL.  She will get back to breast feeding today and used her manual pump this a.m. to express a small volume of colostrum.  She will feed this back to baby when he awakens.  Encouraged to continue breast feeding first prior to any formula supplementation.  Suggested she pump prn with her DEBP to encourage a full supply due to the missed pumpings and feedings yesterday.  Engorgement prevention/treatment reviewed.  Discussed OP LC visits if needed and how to call our office for lactation assistance.  Father present.     Maternal Data    Feeding    LATCH Score                    Lactation Tools Discussed/Used    Interventions Interventions: Education  Discharge Discharge Education: Engorgement and breast care;Outpatient recommendation Pump: Personal;Manual  Consult Status Consult Status: Complete Date: 04/02/21 Follow-up type: Call as needed    Irene Pap Alyssa Mason 04/02/2021, 7:39 AM

## 2021-04-02 NOTE — Discharge Instructions (Signed)
Take procardia 30mg  daily for elevated blood pressure unless symptoms of dizziness or lightheadedness. Elevated blood pressure is any measurement greater than 140/90. Please call the clinic if any concern of severe headache or vision changes.  Postpartum Care After Vaginal Delivery The following information offers guidance about how to care for yourself from the time you deliver your baby to 6-12 weeks after delivery (postpartum period). If you have problems or questions, contact your health care provider for more specific instructions. Follow these instructions at home: Vaginal bleeding  It is normal to have vaginal bleeding (lochia) after delivery. Wear a sanitary pad for bleeding and discharge. ? During the first week after delivery, the amount and appearance of lochia is often similar to a menstrual period. ? Over the next few weeks, it will gradually decrease to a dry, yellow-brown discharge. ? For most women, lochia stops completely by 4-6 weeks after delivery, but can vary.  Change your sanitary pads frequently. Watch for any changes in your flow, such as: ? A sudden increase in volume. ? A change in color. ? Large blood clots.  If you pass a blood clot from your vagina, save it and call your health care provider. Do not flush blood clots down the toilet before talking with your health care provider.  Do not use tampons or douches until your health care provider approves.  If you are not breastfeeding, your period should return 6-8 weeks after delivery. If you are feeding your baby breast milk only, your period may not return until you stop breastfeeding. Perineal care  Keep the area between the vagina and the anus (perineum) clean and dry. Use medicated pads and pain-relieving sprays and creams as directed.  If you had a surgical cut in the perineum (episiotomy) or a tear, check the area for signs of infection until you are healed. Check for: ? More redness, swelling, or  pain. ? Fluid or blood coming from the cut or tear. ? Warmth. ? Pus or a bad smell.  You may be given a squirt bottle to use instead of wiping to clean the perineum area after you use the bathroom. Pat the area gently to dry it.  To relieve pain caused by an episiotomy, a tear, or swollen veins in the anus (hemorrhoids), take a warm sitz bath 2-3 times a day. In a sitz bath, the warm water should only come up to your hips and cover your buttocks.   Breast care  In the first few days after delivery, your breasts may feel heavy, full, and uncomfortable (breast engorgement). Milk may also leak from your breasts. Ask your health care provider about ways to help relieve the discomfort.  If you are breastfeeding: ? Wear a bra that supports your breasts and fits well. Use breast pads to absorb milk that leaks. ? Keep your nipples clean and dry. Apply creams and ointments as told. ? You may have uterine contractions every time you breastfeed for up to several weeks after delivery. This helps your uterus return to its normal size. ? If you have any problems with breastfeeding, notify your health care provider or lactation consultant.  If you are not breastfeeding: ? Avoid touching your breasts. Do not squeeze out (express) milk. Doing this can make your breasts produce more milk. ? Wear a good-fitting bra and use cold packs to help with swelling. Intimacy and sexuality  Ask your health care provider when you can engage in sexual activity. This may depend upon: ? Your risk  of infection. ? How fast you are healing. ? Your comfort and desire to engage in sexual activity.  You are able to get pregnant after delivery, even if you have not had your period. Talk with your health care provider about methods of birth control (contraception) or family planning if you desire future pregnancies. Medicines  Take over-the-counter and prescription medicines only as told by your health care provider.  Take  an over-the-counter stool softener to help ease bowel movements as told by your health care provider.  If you were prescribed an antibiotic medicine, take it as told by your health care provider. Do not stop taking the antibiotic even if you start to feel better.  Review all previous and current prescriptions to check for possible transfer into breast milk. Activity  Gradually return to your normal activities as told by your health care provider.  Rest as much as possible. Nap while your baby is sleeping. Eating and drinking  Drink enough fluid to keep your urine pale yellow.  To help prevent or relieve constipation, eat high-fiber foods every day.  Choose healthy eating to support breastfeeding or weight loss goals.  Take your prenatal vitamins until your health care provider tells you to stop.   General tips/recommendations  Do not use any products that contain nicotine or tobacco. These products include cigarettes, chewing tobacco, and vaping devices, such as e-cigarettes. If you need help quitting, ask your health care provider.  Do not drink alcohol, especially if you are breastfeeding.  Do not take medications or drugs that are not prescribed to you, especially if you are breastfeeding.  Visit your health care provider for a postpartum checkup within the first 3-6 weeks after delivery.  Complete a comprehensive postpartum visit no later than 12 weeks after delivery.  Keep all follow-up visits for you and your baby. Contact a health care provider if:  You feel unusually sad or worried.  Your breasts become red, painful, or hard.  You have a fever or other signs of an infection.  You have bleeding that is soaking through one pad an hour or you have blood clots.  You have a severe headache that doesn't go away or you have vision changes.  You have nausea and vomiting and are unable to eat or drink anything for 24 hours. Get help right away if:  You have chest pain or  difficulty breathing.  You have sudden, severe leg pain.  You faint or have a seizure.  You have thoughts about hurting yourself or your baby. If you ever feel like you may hurt yourself or others, or have thoughts about taking your own life, get help right away. Go to your nearest emergency department or:  Call your local emergency services (911 in the [place]  The National Suicide Prevention Lifeline at 3308423131. This suicide crisis helpline is open 24 hours a day.  Text the Crisis Text Line at (619) 146-0843 (in the [place] Summary  The period of time after you deliver your newborn up to 6-12 weeks after delivery is called the postpartum period.  Keep all follow-up visits for you and your baby.  Review all previous and current prescriptions to check for possible transfer into breast milk.  Contact a health care provider if you feel unusually sad or worried during the postpartum period. This information is not intended to replace advice given to you by your health care provider. Make sure you discuss any questions you have with your health care provider. Document Revised: 08/04/2020  Document Reviewed: 08/04/2020 Elsevier Patient Education  2021 [employer] Inc[employer]   Tubal Ligation Reversal, Care After The following information offers guidance on how to care for yourself after your procedure. Your health care provider may also give you more specific instructions. If you have problems or questions, contact your health care provider. What can I expect after the procedure? After the procedure, it is common to have:  Mild discomfort in the abdomen. This can include: ? Mild cramping. ? Gas pains or feeling bloated. ? Pain or soreness at the incision areas.  Discomfort in the shoulder area. This is caused by air that is trapped between your liver and your diaphragm. The discomfort will slowly go away on its own.  Tiredness.  A sore throat if you had a breathing tube.  Nausea or  vomiting.  Vaginal discharge which may require you to wear a sanitary napkin. Follow these instructions at home: Medicines  Take over-the-counter and prescription medicines only as told by your health care provider.  Do not take aspirin because it can cause bleeding.  Do not drive or use heavy machinery while taking prescription pain medicine.  Ask your health care provider if the medicine prescribed to you: ? Requires you to avoid driving or using machinery. ? Can cause constipation. You may need to take these actions to prevent or treat constipation:  Drink enough fluid to keep your urine pale yellow.  Take over-the-counter or prescription medicines.  Eat foods that are high in fiber, such as beans, whole grains, and fresh fruits and vegetables.  Limit foods that are high in fat and processed sugars, such as fried or sweet foods. Activity  Rest as told by your health care provider.  Avoid sitting for a long time without moving. Get up to take short walks every 1-2 hours. This is important to improve blood flow and breathing. Ask for help if you feel weak or unsteady.  Do not douche, use tampons, or have sexual intercourse for an entire menstrual cycle, or as told by your health care provider.  Do not lift anything that is heavier than 10 lb (4.5 kg), or the limit that you are told, until your health care provider says that it is safe.  Have someone help you with your daily household tasks for the first 7-10 days, or as recommended by your health care provider.  Return to your normal activities as told by your health care provider. Ask your health care provider what activities are safe for you.   Incision care  Follow instructions from your health care provider about how to take care of your incisions. Make sure you: ? Wash your hands with soap and water for at least 20 seconds before and after you change your bandage (dressing). If soap and water are not available, use hand  sanitizer. ? Change your dressing as told by your health care provider. ? Leave stitches (sutures), skin glue, or adhesive strips in place. These skin closures may need to stay in place for 2 weeks or longer. If adhesive strip edges start to loosen and curl up, you may trim the loose edges. Do not remove adhesive strips completely unless your health care provider tells you to do that.  Do not take baths, swim, or use a hot tub until your health care provider approves. Ask your health care provider if you may take showers. You may only be allowed to take sponge baths.  Check your incision area every day for signs of infection. Check  for: ? Redness, swelling, or more pain. ? Fluid or blood. ? Warmth. ? Pus or a bad smell.   General instructions  Do not use any products that contain nicotine or tobacco. These products include cigarettes, chewing tobacco, and vaping devices, such as e-cigarettes. If you need help quitting, ask your health care provider.  You may need to wear a sanitary napkin for vaginal discharge after the procedure.  Keep all follow-up visits. This is important. ? You will need to have an X-ray dye test about 3-4 months after the surgery to make sure that your tubes are open. Contact a health care provider if:  You have signs of infection, such as: ? Redness, swelling, or more pain around your incision sites. ? Fluid or blood coming from an incision. ? An incision that feels warm to the touch. ? Pus or a bad smell coming from an incision.  Your incision breaks open.  You have a fever.  You have a rash.  You feel dizzy or lightheaded.  You cannot eat or drink without vomiting.  You are constipated.  You feel a burning sensation or pain when you urinate. Get help right away if:  You have shortness of breath or difficulty breathing.  You have chest pain, leg pain, or leg swelling.  You faint.  You have increasing pain in your abdomen, and the pain is not  relieved by pain medicine.  You have heavy vaginal bleeding, soaking through a sanitary napkin in less than 1 hour, when it is not time for your menstrual period. These symptoms may represent a serious problem that is an emergency. Do not wait to see if the symptoms will go away. Get medical help right away. Call your local emergency services (911 in the [place] Do not drive yourself to the hospital. Summary  It is common to have mild abdominal pain, gas pain and bloating, and tiredness after the procedure.  Take over-the-counter and prescription medicines only as told by your health care provider.  Check your incision area every day for signs of infection. Contact your health care provider if any signs of infection are present.  Keep all follow-up visits as told by your health care provider. This is important. You will need to have a follow-up X-ray dye test about 3-4 months after the surgery to make sure that your tubes are open. This information is not intended to replace advice given to you by your health care provider. Make sure you discuss any questions you have with your health care provider. Document Revised: 08/05/2020 Document Reviewed: 08/05/2020 Elsevier Patient Education  2021 [employer]

## 2021-04-04 LAB — SURGICAL PATHOLOGY

## 2021-04-06 ENCOUNTER — Other Ambulatory Visit: Payer: BC Managed Care – PPO | Admitting: Obstetrics & Gynecology

## 2021-04-06 ENCOUNTER — Other Ambulatory Visit: Payer: Self-pay | Admitting: Advanced Practice Midwife

## 2021-04-06 MED ORDER — AMLODIPINE BESYLATE 5 MG PO TABS: 5.0000 mg | ORAL_TABLET | Freq: Every day | ORAL | 0 refills | Status: DC

## 2021-04-06 NOTE — Progress Notes (Signed)
Procardia caused rash. Change to norvasc

## 2021-04-11 ENCOUNTER — Other Ambulatory Visit: Payer: Self-pay

## 2021-04-11 ENCOUNTER — Telehealth (INDEPENDENT_AMBULATORY_CARE_PROVIDER_SITE_OTHER): Payer: BC Managed Care – PPO | Admitting: *Deleted

## 2021-04-11 VITALS — BP 118/86 | HR 77

## 2021-04-11 DIAGNOSIS — Z0130 Encounter for examination of blood pressure without abnormal findings: Secondary | ICD-10-CM | POA: Diagnosis not present

## 2021-04-11 NOTE — Progress Notes (Addendum)
   NURSE VISIT- BLOOD PRESSURE CHECK  I connected with@ on 04/11/2021 by telephone  and verified that I am speaking with the correct person using two identifiers.   I discussed the limitations of evaluation and management by telemedicine. The patient expressed understanding and agreed to proceed.  Nurse is at the office, and patient is at home.  SUBJECTIVE:  Alyssa Mason is a 35 y.o. [phone] female here for BP check. She is postpartum, delivery date 03/31/21    HYPERTENSION ROS:  Postpartum:  . Severe headaches that don't go away with tylenol/other medicines: No  . Visual changes (seeing spots/double/blurred vision) No  . Severe pain under right breast breast or in center of upper chest No  . Severe nausea/vomiting No  . Taking medicines as instructed yes   OBJECTIVE:  BP 118/86 (BP Location: Right Arm, Patient Position: Sitting, Cuff Size: Normal)   Pulse 77   Breastfeeding Yes   Appearance oriented to person, place, and time.  ASSESSMENT: Postpartum  blood pressure check  PLAN: Discussed with [name], CNM, [facility]   Recommendations: stop medicine 2 days before next visit   Follow-up: as scheduled   Jobe Marker  04/11/2021 9:36 AM  Chart reviewed for nurse visit. Agree with plan of care.  Cheral Marker, [place] 04/11/2021 2:14 PM

## 2021-05-05 ENCOUNTER — Other Ambulatory Visit: Payer: Self-pay

## 2021-05-05 ENCOUNTER — Encounter: Payer: Self-pay | Admitting: Obstetrics & Gynecology

## 2021-05-05 ENCOUNTER — Ambulatory Visit (INDEPENDENT_AMBULATORY_CARE_PROVIDER_SITE_OTHER): Payer: BC Managed Care – PPO | Admitting: Obstetrics & Gynecology

## 2021-05-05 NOTE — Progress Notes (Signed)
POSTPARTUM VISIT Patient name: Alyssa Mason MRN [id number]  Date of birth: [birth year] Chief Complaint:   Postpartum Care  History of Present Illness:   Alyssa Mason is a 35 y.o. [phone] female being seen today for a postpartum visit. She is 5 weeks postpartum following a vacuum-assisted vaginal delivery at 106w4dgestational weeks. IOL: No,   Pregnancy complicated by anemia and postpartum elevated BP- started on Norvasc- no longer taking  Last pap smear: 09/2020   Postpartum course has been uncomplicated.  Bleeding intermittent, light. Bowel function is normal. Bladder function is normal. Urinary incontinence? No, fecal incontinence? No Patient is not sexually active. Last sexual activity: prior to delivery.   Desired contraception: BTL done PP.   Upstream - 05/05/21 1042      Pregnancy Intention Screening   Does the patient want to become pregnant in the next year? No    Does the patient's partner want to become pregnant in the next year? No    Would the patient like to discuss contraceptive options today? No      Contraception Wrap Up   Current Method Female Sterilization    End Method Female Sterilization    Contraception Counseling Provided No            Edinburgh Postpartum Depression Screening: Negative  Edinburgh Postnatal Depression Scale - 05/05/21 1042      Edinburgh Postnatal Depression Scale:  In the Past 7 Days   I have been able to laugh and see the funny side of things. 0    I have looked forward with enjoyment to things. 0    I have blamed myself unnecessarily when things went wrong. 1    I have been anxious or worried for no good reason. 2    I have felt scared or panicky for no good reason. 0    Things have been getting on top of me. 1    I have been so unhappy that I have had difficulty sleeping. 0    I have felt sad or miserable. 1    I have been so unhappy that I have been crying. 0    The thought of harming myself has occurred to me. 0     Edinburgh Postnatal Depression Scale Total 5           Baby's course has been uncomplicated. Baby is feeding by breast and bottle: milk supply inadequate . Infant has a pediatrician/family doctor? Yes.  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  Pt has material needs met for her and baby: Yes.    Review of Systems:   Pertinent items are noted in HPI Denies Abnormal vaginal discharge w/ itching/odor/irritation, headaches, visual changes, shortness of breath, chest pain, abdominal pain, severe nausea/vomiting, or problems with urination or bowel movements. Pertinent History Reviewed:  Reviewed past medical,surgical, obstetrical and family history.  Reviewed problem list, medications and allergies. OB History  Gravida Para Term Preterm AB Living  _0 0 1 2  SAB IAB Ectopic Multiple Live Births  1 0 0 0 2    # Outcome Date GA Lbr Len/2nd Weight Sex Delivery Anes PTL Lv  3 Term 03/31/21 334w4d7:29 / 00:31 7 lb 6.7 oz (3.365 kg) M Vag-Vacuum EPI  LIV  2 SAB 2019          1 Term 05/22/12 [id number]:01 / 01:58 7 lb 12.7 oz (3.535 kg) F Vag-Vacuum EPI  LIV   Physical Assessment:   Vitals:  05/05/21 1037  BP: 121/76  Pulse: 81  Weight: 183 lb 3.2 oz (83.1 kg)  Body mass index is 34.62 kg/m.       Physical Examination:   General appearance: alert, well appearing, and in no distress  Mental status: alert, oriented to person, place, and time  Skin: warm & dry   Breast: no masses or evidence of infection bilaterally  Cardiovascular: normal heart rate noted   Respiratory: normal respiratory effort, no distress   Breasts: deferred, no complaints   Abdomen: soft, non-tender   Pelvic: normal external genitalia, vulva, vagina, cervix, uterus and adnexa  Rectal: no hemorrhoids  Extremities: no edema  Chaperone: Angel Neas         No results found for this or any previous visit (from the past 24 hour(s)).  Assessment & Plan:  1) Postpartum exam 2) 5 wks s/p  spontaneous vaginal delivery- vacuum assisted 3) breast & bottle feeding 4) Depression screening 5) Contraception management: postpartum tubal ligation   Follow up for annual in 22yror as clinically indicated  Essential components of care per ACOG recommendations:  1.  Mood and well being:  . If positive depression screen, discussed and plan developed.  . If using tobacco we discussed reduction/cessation and risk of relapse . If current substance abuse, we discussed and referral to local resources was offered.   2. Infant care and feeding:  . If breastfeeding, discussed returning to work, pumping, breastfeeding-associated pain, guidance regarding return to fertility while lactating if not using another method . Recommended that all caregivers be immunized for flu, pertussis and other preventable communicable diseases  3. Sexuality, contraception and birth spacing . Provided guidance regarding sexuality, management of dyspareunia, and resumption of intercourse . Discussed avoiding interpregnancy interval <638ms and recommended birth spacing of 18 months  4. Sleep and fatigue . Discussed coping options for fatigue and sleep disruption . Encouraged family/partner/community support of 4 hrs of uninterrupted sleep to help with mood and fatigue  5. Physical recovery  . If pt had a C/S, assessed incisional pain and providing guidance on normal vs prolonged recovery . If pt had a laceration, perineal healing and pain reviewed.  . If urinary or fecal incontinence, discussed management and referred to PT or uro/gyn if indicated  . Patient is safe to resume physical activity. Discussed attainment of healthy weight.  6. Health maintenance . Mammogram at 405yor earlier if indicated . Pap smears as indicated  Meds: No orders of the defined types were placed in this encounter.   Follow-up: No follow-ups on file.   No orders of the defined types were placed in this encounter.   JeClearnce Sorrelzan 05/05/2021 10:47 AM

## 2021-08-16 DIAGNOSIS — Z1389 Encounter for screening for other disorder: Secondary | ICD-10-CM | POA: Diagnosis not present

## 2021-08-16 DIAGNOSIS — E6609 Other obesity due to excess calories: Secondary | ICD-10-CM | POA: Diagnosis not present

## 2021-08-16 DIAGNOSIS — Z1331 Encounter for screening for depression: Secondary | ICD-10-CM | POA: Diagnosis not present

## 2021-08-16 DIAGNOSIS — Z0000 Encounter for general adult medical examination without abnormal findings: Secondary | ICD-10-CM | POA: Diagnosis not present

## 2021-08-16 DIAGNOSIS — J45909 Unspecified asthma, uncomplicated: Secondary | ICD-10-CM | POA: Diagnosis not present

## 2021-08-16 DIAGNOSIS — Z6834 Body mass index (BMI) 34.0-34.9, adult: Secondary | ICD-10-CM | POA: Diagnosis not present

## 2022-02-25 DIAGNOSIS — H109 Unspecified conjunctivitis: Secondary | ICD-10-CM | POA: Diagnosis not present

## 2022-02-25 DIAGNOSIS — B9689 Other specified bacterial agents as the cause of diseases classified elsewhere: Secondary | ICD-10-CM | POA: Diagnosis not present

## 2022-03-07 ENCOUNTER — Ambulatory Visit: Payer: BC Managed Care – PPO | Admitting: Advanced Practice Midwife

## 2022-03-07 ENCOUNTER — Other Ambulatory Visit: Payer: Self-pay | Admitting: Adult Health

## 2022-03-07 ENCOUNTER — Other Ambulatory Visit (INDEPENDENT_AMBULATORY_CARE_PROVIDER_SITE_OTHER): Payer: BC Managed Care – PPO | Admitting: *Deleted

## 2022-03-07 DIAGNOSIS — R300 Dysuria: Secondary | ICD-10-CM | POA: Diagnosis not present

## 2022-03-07 DIAGNOSIS — R350 Frequency of micturition: Secondary | ICD-10-CM | POA: Diagnosis not present

## 2022-03-07 DIAGNOSIS — M545 Low back pain, unspecified: Secondary | ICD-10-CM

## 2022-03-07 MED ORDER — SULFAMETHOXAZOLE-TRIMETHOPRIM 800-160 MG PO TABS: 1.0000 | ORAL_TABLET | Freq: Two times a day (BID) | ORAL | 0 refills | Status: DC

## 2022-03-07 NOTE — Progress Notes (Signed)
? ?  NURSE VISIT- UTI SYMPTOMS  ? ?SUBJECTIVE:  ?Alyssa Mason is a 36 y.o. [phone] female here for UTI symptoms. She is a GYN patient. She reports dysuria, flank pain bilaterally, urinary frequency, and urinary urgency. ? ?OBJECTIVE:  ?There were no vitals taken for this visit.  ?Appears well, in no apparent distress ? ?No results found for this or any previous visit (from the past 24 hour(s)). ? ?ASSESSMENT: ?GYN patient with UTI symptoms and unable to read strip due to Azo use, unknown nitrites ? ?PLAN: ?Note routed to [facility], AGNP   ?Rx sent by provider today: Yes ?Urine culture sent ?Call or return to clinic prn if these symptoms worsen or fail to improve as anticipated. ?Follow-up: as needed  ? ?Annamarie Dawley  ?03/07/2022 ?11:22 AM  ?

## 2022-03-07 NOTE — Progress Notes (Signed)
Will rx septra ds  

## 2022-03-09 LAB — URINE CULTURE: Organism ID, Bacteria: NO GROWTH

## 2022-11-20 DIAGNOSIS — E6609 Other obesity due to excess calories: Secondary | ICD-10-CM | POA: Diagnosis not present

## 2022-11-20 DIAGNOSIS — J069 Acute upper respiratory infection, unspecified: Secondary | ICD-10-CM | POA: Diagnosis not present

## 2022-11-20 DIAGNOSIS — Z6830 Body mass index (BMI) 30.0-30.9, adult: Secondary | ICD-10-CM | POA: Diagnosis not present

## 2022-11-20 DIAGNOSIS — J4520 Mild intermittent asthma, uncomplicated: Secondary | ICD-10-CM | POA: Diagnosis not present

## 2023-07-10 NOTE — Progress Notes (Signed)
This encounter was created in error - please disregard.

## 2023-09-25 ENCOUNTER — Ambulatory Visit
Admission: EM | Admit: 2023-09-25 | Discharge: 2023-09-25 | Disposition: A | Discharge status: Home / Self Care | Payer: BC Managed Care – PPO | Attending: Family Medicine | Admitting: Family Medicine

## 2023-09-25 DIAGNOSIS — J4521 Mild intermittent asthma with (acute) exacerbation: Secondary | ICD-10-CM | POA: Diagnosis not present

## 2023-09-25 DIAGNOSIS — J3089 Other allergic rhinitis: Secondary | ICD-10-CM | POA: Diagnosis not present

## 2023-09-25 MED ORDER — CETIRIZINE HCL 10 MG PO TABS: 10.0000 mg | ORAL_TABLET | Freq: Every day | ORAL | 2 refills | Status: DC

## 2023-09-25 MED ORDER — FLUTICASONE PROPIONATE 50 MCG/ACT NA SUSP: 1.0000 | Freq: Two times a day (BID) | NASAL | 2 refills | Status: DC

## 2023-09-25 MED ORDER — PREDNISONE 20 MG PO TABS: 40.0000 mg | ORAL_TABLET | Freq: Every day | ORAL | 0 refills | Status: DC

## 2023-09-25 MED ORDER — ALBUTEROL SULFATE (2.5 MG/3ML) 0.083% IN NEBU: 2.5000 mg | INHALATION_SOLUTION | Freq: Four times a day (QID) | RESPIRATORY_TRACT | 0 refills | Status: AC | PRN

## 2023-09-25 NOTE — ED Provider Notes (Signed)
[facility]    CSN: [id number] Arrival date & time: 09/25/23  0810      History   Chief Complaint No chief complaint on file.   HPI Alyssa Mason is a 37 y.o. female.   Patient presenting today with about a month of waxing and waning chest tightness, occasional shortness of breath, wheezing, dry cough.  She denies chest pain, significant shortness of breath, fever, chills, abdominal pain, nausea vomiting or diarrhea.  Takes Mucinex DM and her albuterol inhaler for her asthma which helped while she is taking them but symptoms returned after she stops.  She is not currently on anything for seasonal allergies.  No known sick contacts recently.    Past Medical History:  Diagnosis Date   Anemia    Asthma    prn albuterol   Chronic kidney disease    kidney stones   H/O pyelonephritis    H/O varicella    History of kidney stones     Patient Active Problem List   Diagnosis Date Noted   Supervision of other normal pregnancy, antepartum 03/31/2021   Tubal ligation evaluation 04/10/2018   Heart palpitations 09/19/2016    Past Surgical History:  Procedure Laterality Date   NO PAST SURGERIES     TUBAL LIGATION N/A 04/01/2021   Procedure: POST PARTUM TUBAL LIGATION;  Surgeon: Hermina Staggers, MD;  Location: [facility];  Service: Gynecology;  Laterality: N/A;    OB History     Gravida  3   Para  2   Term  2   Preterm  0   AB  1   Living  2      SAB  1   IAB  0   Ectopic  0   Multiple  0   Live Births  2            Home Medications    Prior to Admission medications   Medication Sig Start Date End Date Taking? Authorizing Provider  albuterol (PROVENTIL) (2.5 MG/3ML) 0.083% nebulizer solution Take 3 mLs (2.5 mg total) by nebulization every 6 (six) hours as needed for wheezing or shortness of breath. 09/25/23  Yes Particia Nearing, PA-C  cetirizine (ZYRTEC ALLERGY) 10 MG tablet Take 1 tablet (10 mg total) by mouth daily. 09/25/23  Yes  Particia Nearing, PA-C  fluticasone [facility]) 50 MCG/ACT nasal spray Place 1 spray into both nostrils 2 (two) times daily. 09/25/23  Yes Particia Nearing, PA-C  predniSONE (DELTASONE) 20 MG tablet Take 2 tablets (40 mg total) by mouth daily with breakfast. 09/25/23  Yes Particia Nearing, PA-C  acetaminophen (TYLENOL) 325 MG tablet Take 2 tablets (650 mg total) by mouth every 6 (six) hours as needed for mild pain, moderate pain, fever or headache (for pain scale < 4). Patient not taking: No sig reported 04/02/21   Sheila Oats, MD  albuterol (PROVENTIL HFA;VENTOLIN HFA) 108 (90 Base) MCG/ACT inhaler Inhale 2 puffs into the lungs every 4 (four) hours as needed for wheezing. Patient not taking: No sig reported 11/28/18   Babs Sciara, MD  albuterol (PROVENTIL) (2.5 MG/3ML) 0.083% nebulizer solution inhale contents of 1 in nebulizer every 6 hours if needed Patient not taking: No sig reported 10/02/16   Merlyn Albert, MD  amLODipine (NORVASC) 5 MG tablet Take 1 tablet (5 mg total) by mouth daily. Patient not taking: Reported on 05/05/2021 04/06/21   Jacklyn Shell, CNM  cetirizine (ZYRTEC) 10 MG tablet Take  10 mg by mouth as needed.    [provider]  coconut oil OIL Apply 1 application topically as needed (nipple pain). Patient not taking: Reported on 05/05/2021 04/02/21   Sheila Oats, MD  ibuprofen (ADVIL) 600 MG tablet Take 1 tablet (600 mg total) by mouth every 6 (six) hours. Patient not taking: No sig reported 04/02/21   Sheila Oats, MD  Prenatal Vit-Fe Fumarate-FA (PRENATAL VITAMIN PO) Take by mouth.    [provider]  sulfamethoxazole-trimethoprim (BACTRIM DS) 800-160 MG tablet Take 1 tablet by mouth 2 (two) times daily. Take 1 bid 03/07/22   Adline Potter, NP    Family History Family History  Problem Relation Age of Onset   COPD Mother    Heart disease Father        heart murmur   Hypertension Father    Thyroid disease Father     Rheum arthritis Father    Thyroid disease Maternal Grandmother    Colon cancer Maternal Grandmother    Rheum arthritis Paternal Grandmother    Heart disease Paternal Grandfather    Cancer Paternal Grandfather        lung    Social History Social History   Tobacco Use   Smoking status: Former    Current packs/day: 0.50    Average packs/day: 0.5 packs/day for 10.0 years (5.0 ttl pk-yrs)    Types: Cigarettes   Smokeless tobacco: Never  Vaping Use   Vaping status: Never Used  Substance Use Topics   Alcohol use: No   Drug use: No     Allergies   Banana and Watermelon concentrate [citrullus vulgaris]   Review of Systems Review of Systems Per HPI  Physical Exam Triage Vital Signs ED Triage Vitals  Encounter Vitals Group     BP 09/25/23 0824 126/75     Systolic BP Percentile --      Diastolic BP Percentile --      Pulse Rate 09/25/23 0824 69     Resp 09/25/23 0824 17     Temp 09/25/23 0824 98 F (36.7 C)     Temp Source 09/25/23 0824 Oral     SpO2 09/25/23 0824 100 %     Weight --      Height --      Head Circumference --      Peak Flow --      Pain Score 09/25/23 0827 4     Pain Loc --      Pain Education --      Exclude from Growth Chart --    No data found.  Updated Vital Signs BP 126/75 (BP Location: Right Arm)   Pulse 69   Temp 98 F (36.7 C) (Oral)   Resp 17   LMP 09/23/2023 (Exact Date)   SpO2 100%   Breastfeeding No   Visual Acuity Right Eye Distance:   Left Eye Distance:   Bilateral Distance:    Right Eye Near:   Left Eye Near:    Bilateral Near:     Physical Exam Vitals and nursing note reviewed.  Constitutional:      Appearance: Normal appearance.  HENT:     Head: Atraumatic.     Right Ear: Tympanic membrane and external ear normal.     Left Ear: Tympanic membrane and external ear normal.     Nose:     Comments: Bilateral nasal mucosa boggy and erythematous    Mouth/Throat:     Mouth: Mucous membranes are  moist.      Pharynx: Posterior oropharyngeal erythema present.  Eyes:     Extraocular Movements: Extraocular movements intact.     Conjunctiva/sclera: Conjunctivae normal.  Cardiovascular:     Rate and Rhythm: Normal rate and regular rhythm.     Heart sounds: Normal heart sounds.  Pulmonary:     Effort: Pulmonary effort is normal.     Breath sounds: Normal breath sounds. No wheezing or rales.  Musculoskeletal:        General: Normal range of motion.     Cervical back: Normal range of motion and neck supple.  Skin:    General: Skin is warm and dry.  Neurological:     Mental Status: She is alert and oriented to person, place, and time.  Psychiatric:        Mood and Affect: Mood normal.        Thought Content: Thought content normal.      UC Treatments / Results  Labs (all labs ordered are listed, but only abnormal results are displayed) Labs Reviewed - No data to display  EKG   Radiology No results found.  Procedures Procedures (including critical care time)  Medications Ordered in UC Medications - No data to display  Initial Impression / Assessment and Plan / UC Course  I have reviewed the triage vital signs and the nursing notes.  Pertinent labs & imaging results that were available during my care of the patient were reviewed by me and considered in my medical decision making (see chart for details).     Suspect seasonal allergy and asthma exacerbation.  Treat with prednisone, refill albuterol neb solution, continue albuterol inhaler as needed.  Start good daily allergy regimen of Zyrtec and Flonase additionally.  Lungs clear to auscultation bilaterally today, oxygen saturation 100% so x-ray imaging deferred with shared decision making.  Return for any worsening symptoms.  Final Clinical Impressions(s) / UC Diagnoses   Final diagnoses:  Seasonal allergic rhinitis due to other allergic trigger  Mild intermittent asthma with acute exacerbation   Discharge Instructions    None    ED Prescriptions     Medication Sig Dispense Auth. Provider   predniSONE (DELTASONE) 20 MG tablet Take 2 tablets (40 mg total) by mouth daily with breakfast. 10 tablet Particia Nearing, PA-C   albuterol (PROVENTIL) (2.5 MG/3ML) 0.083% nebulizer solution Take 3 mLs (2.5 mg total) by nebulization every 6 (six) hours as needed for wheezing or shortness of breath. 75 mL Particia Nearing, PA-C   cetirizine (ZYRTEC ALLERGY) 10 MG tablet Take 1 tablet (10 mg total) by mouth daily. 30 tablet Particia Nearing, PA-C   fluticasone [facility]) 50 MCG/ACT nasal spray Place 1 spray into both nostrils 2 (two) times daily. 16 g Particia Nearing, [place]      PDMP not reviewed this encounter.   Roosvelt Maser [place], [place] 09/25/23 [phone]

## 2023-09-25 NOTE — ED Triage Notes (Signed)
Pt c/o chest congestion, SOB, feels like her lungs are burning, hard to catch a breath, and unable to cough up congestion x 1 mo, states she has used Mucinex DM it helps at first but then sx's return

## 2024-04-22 ENCOUNTER — Ambulatory Visit (INDEPENDENT_AMBULATORY_CARE_PROVIDER_SITE_OTHER): Admitting: Adult Health

## 2024-04-22 ENCOUNTER — Other Ambulatory Visit (HOSPITAL_COMMUNITY)
Admission: RE | Admit: 2024-04-22 | Discharge: 2024-04-22 | Disposition: A | Discharge status: Home / Self Care | Source: Ambulatory Visit | Attending: Adult Health | Admitting: Adult Health

## 2024-04-22 ENCOUNTER — Encounter: Payer: Self-pay | Admitting: Adult Health

## 2024-04-22 VITALS — BP 132/85 | HR 76 | Ht 61.0 in | Wt 154.0 lb

## 2024-04-22 DIAGNOSIS — Z01419 Encounter for gynecological examination (general) (routine) without abnormal findings: Secondary | ICD-10-CM | POA: Diagnosis not present

## 2024-04-22 DIAGNOSIS — Z8719 Personal history of other diseases of the digestive system: Secondary | ICD-10-CM | POA: Diagnosis not present

## 2024-04-22 DIAGNOSIS — Z1331 Encounter for screening for depression: Secondary | ICD-10-CM | POA: Diagnosis not present

## 2024-04-22 DIAGNOSIS — K649 Unspecified hemorrhoids: Secondary | ICD-10-CM | POA: Diagnosis not present

## 2024-04-22 DIAGNOSIS — N644 Mastodynia: Secondary | ICD-10-CM | POA: Diagnosis not present

## 2024-04-22 DIAGNOSIS — Z1211 Encounter for screening for malignant neoplasm of colon: Secondary | ICD-10-CM

## 2024-04-22 DIAGNOSIS — Z13220 Encounter for screening for lipoid disorders: Secondary | ICD-10-CM

## 2024-04-22 LAB — HEMOCCULT GUIAC POC 1CARD (OFFICE): Fecal Occult Blood, POC: NEGATIVE

## 2024-04-22 NOTE — Progress Notes (Signed)
 Patient ID: Alyssa Mason, female   DOB: Apr 07, 1986, 38 y.o.   MRN: [id number] History of Present Illness: Alyssa Mason is a 38 year old white female, married, [id number], in for a well woman gyn exam and pap. She has had pain in left nipple, like needle stick, 3 x lately. Has had rectal bleeding at times, has hemorrhoid. And has felt irritated on vulva area at times.  PCP is [facility]    Current Medications, Allergies, Past Medical History, Past Surgical History, Family History and Social History were reviewed in [name] record.     Review of Systems: Patient denies any headaches, hearing loss, fatigue, blurred vision, shortness of breath, chest pain, abdominal pain, problems with bowel movements, urination, or intercourse. No joint pain or mood swings.  See HPI for positives   Physical Exam:BP 132/85 (BP Location: Left Arm, Patient Position: Sitting, Cuff Size: Normal)   Pulse 76   Ht 5\' 1"  (1.549 m)   Wt 154 lb (69.9 kg)   LMP 04/08/2024 (Approximate)   BMI 29.10 kg/m   General:  Well developed, well nourished, no acute distress Skin:  Warm and dry Neck:  Midline trachea, normal thyroid , good ROM, no lymphadenopathy Lungs; Clear to auscultation bilaterally Breast:  No dominant palpable mass, retraction, or nipple discharge, has regular irregularities  Cardiovascular: Regular rate and rhythm Abdomen:  Soft, non tender, no hepatosplenomegaly Pelvic:  External genitalia is normal in appearance, no lesions.  The vagina is normal in appearance. Urethra has no lesions or masses. The cervix is bulbous, pap with HR HPV genotyping performed.  Uterus is felt to be normal size, shape, and contour.  No adnexal masses or tenderness noted.Bladder is non tender, no masses felt. Rectal: Good sphincter tone, no polyps, +hemorrhoids felt.  Hemoccult negative. Extremities/musculoskeletal:  No swelling or varicosities noted, no clubbing or cyanosis Psych:  No mood changes, alert  and cooperative,seems happy AA is 0 Fall risk is moderate    04/22/2024    3:31 PM 09/20/2020    3:53 PM 04/10/2018   11:22 AM  Depression screen PHQ 2/9  Decreased Interest 0 0 0  Down, Depressed, Hopeless 0 0 0  PHQ - 2 Score 0 0 0  Altered sleeping 0 0   Tired, decreased energy 0 1   Change in appetite 0 0   Feeling bad or failure about yourself  0 0   Trouble concentrating 0 0   Moving slowly or fidgety/restless 0 0   Suicidal thoughts 0 0   PHQ-9 Score 0 1        04/22/2024    3:31 PM 09/20/2020    3:53 PM  GAD 7 : Generalized Anxiety Score  Nervous, Anxious, on Edge 0 0  Control/stop worrying 0 0  Worry too much - different things 0 0  Trouble relaxing 0 0  Restless 0 0  Easily annoyed or irritable 0 0  Afraid - awful might happen 0 0  Total GAD 7 Score 0 0    Upstream - 04/22/24 1523       Pregnancy Intention Screening   Does the patient want to become pregnant in the next year? No    Does the patient's partner want to become pregnant in the next year? No    Would the patient like to discuss contraceptive options today? No      Contraception Wrap Up   Current Method Female Sterilization    End Method Female Sterilization    Contraception  Counseling Provided No              Examination chaperoned by Alphonso Aschoff LPN   Impression and plan: 1. Encounter for gynecological examination with Papanicolaou smear of cervix (Primary) Pap sent Pap in 3 years if negative Physical in 1 year Check labs fasting, has orders - Cytology - PAP( [place]) - CBC - Comprehensive metabolic panel with GFR - Lipid panel  2. Hemorrhoids, unspecified hemorrhoid type Use preparation H and tucks Can use Aquaphor 3N1 cream if irritated  3. History of rectal bleeding Hemoccult was negative, probably secondary to hemorrhoid - POCT occult blood stool  4. Breast pain, left Has sharp pain left nipple Scheduled diagnostic mammogram and US  for 05/12/24 at 10:20 am at [facility]  - US  LIMITED ULTRASOUND INCLUDING AXILLA RIGHT BREAST; Future - MM 3D DIAGNOSTIC MAMMOGRAM BILATERAL BREAST; Future - US  LIMITED ULTRASOUND INCLUDING AXILLA LEFT BREAST ; Future  5. Screening cholesterol level - Lipid panel  6. Encounter for screening fecal occult blood testing Hemoccult negative  - POCT occult blood stool

## 2024-04-29 LAB — CYTOLOGY - PAP
Adequacy: ABSENT
Comment: NEGATIVE
Diagnosis: NEGATIVE
High risk HPV: NEGATIVE

## 2024-04-30 ENCOUNTER — Ambulatory Visit: Payer: Self-pay | Admitting: Adult Health

## 2024-05-12 ENCOUNTER — Encounter (HOSPITAL_COMMUNITY): Payer: Self-pay

## 2024-05-12 ENCOUNTER — Ambulatory Visit (HOSPITAL_COMMUNITY)
Admission: RE | Admit: 2024-05-12 | Discharge: 2024-05-12 | Disposition: A | Discharge status: Home / Self Care | Source: Ambulatory Visit | Attending: Adult Health | Admitting: Adult Health

## 2024-05-12 DIAGNOSIS — N644 Mastodynia: Secondary | ICD-10-CM

## 2024-05-12 DIAGNOSIS — R92333 Mammographic heterogeneous density, bilateral breasts: Secondary | ICD-10-CM | POA: Diagnosis not present

## 2024-07-30 ENCOUNTER — Encounter: Payer: Self-pay | Admitting: Adult Health

## 2024-07-30 ENCOUNTER — Ambulatory Visit: Admitting: Adult Health

## 2024-07-30 VITALS — BP 128/86 | HR 83 | Ht 61.0 in | Wt 134.0 lb

## 2024-07-30 DIAGNOSIS — L80 Vitiligo: Secondary | ICD-10-CM

## 2024-07-30 DIAGNOSIS — Z01419 Encounter for gynecological examination (general) (routine) without abnormal findings: Secondary | ICD-10-CM | POA: Diagnosis not present

## 2024-07-30 DIAGNOSIS — Z13220 Encounter for screening for lipoid disorders: Secondary | ICD-10-CM | POA: Diagnosis not present

## 2024-07-30 DIAGNOSIS — N9089 Other specified noninflammatory disorders of vulva and perineum: Secondary | ICD-10-CM | POA: Diagnosis not present

## 2024-07-30 MED ORDER — TRIAMCINOLONE ACETONIDE 0.5 % EX OINT: 1.0000 | TOPICAL_OINTMENT | Freq: Two times a day (BID) | CUTANEOUS | 1 refills | Status: AC

## 2024-07-30 NOTE — Progress Notes (Signed)
  Subjective:     Patient ID: Alyssa Mason, female   DOB: 01/30/1986, 38 y.o.   MRN: [id number]  HPI Alyssa Mason is a 38 year old white female,married, G3P2012, in complaining of vulva irritation and itching and feels like tears easily, has been happening for a while now >3 months. She decreased sex drive too, but life is busy.    Component Value Date/Time   DIAGPAP  04/22/2024 1525    - Negative for intraepithelial lesion or malignancy (NILM)   DIAGPAP  09/20/2020 1550    - Negative for intraepithelial lesion or malignancy (NILM)   DIAGPAP  09/19/2016 0000    NEGATIVE FOR INTRAEPITHELIAL LESIONS OR MALIGNANCY.   HPVHIGH Negative 04/22/2024 1525   HPVHIGH Negative 09/20/2020 1550   ADEQPAP  04/22/2024 1525    Satisfactory for evaluation; transformation zone component ABSENT.   ADEQPAP  09/20/2020 1550    Satisfactory for evaluation; transformation zone component PRESENT.   ADEQPAP  09/19/2016 0000    Satisfactory for evaluation  endocervical/transformation zone component PRESENT.   PCP is [facility]  Review of Systems +vulva irritation and itching and feels like tears easily, has been happening for a while now >3 months. +decreased sex drive too, but life is busy. Occasional pain with sex    Denies any problems with urination or bowel movements Reviewed past medical,surgical, social and family history. Reviewed medications and allergies.  Objective:   Physical Exam BP 128/86 (BP Location: Left Arm, Patient Position: Sitting, Cuff Size: Normal)   Pulse 83   Ht 5' 1 (1.549 m)   Wt 134 lb (60.8 kg)   LMP 07/16/2024 (Approximate)   BMI 25.32 kg/m     Skin warm and dry.Pelvic: external genitalia is normal in appearance, has white area at clitoral area, labia slightly red, vagina: pink,no tears noted,urethra has no lesions or masses noted, cervix:smooth and bulbous, uterus: normal size, shape and contour, non tender, no masses felt, adnexa: no masses or tenderness noted. Bladder is non  tender and no masses felt.  Upstream - 07/30/24 0925       Pregnancy Intention Screening   Does the patient want to become pregnant in the next year? No    Does the patient's partner want to become pregnant in the next year? No    Would the patient like to discuss contraceptive options today? No      Contraception Wrap Up   Current Method Female Sterilization    End Method Female Sterilization    Contraception Counseling Provided No         Examination chaperoned by Clarita Salt LPN  Assessment:     1. Vulvar irritation (Primary) vulva irritation and itching and feels like tears easily, has been happening for a while now >3 months.  Will rx kenalog  Meds ordered this encounter  Medications   triamcinolone  ointment (KENALOG ) 0.5 %    Sig: Apply 1 Application topically 2 (two) times daily. For 2 weeks then 2-3 x weekly if needed to external tissue    Dispense:  30 g    Refill:  1    Supervising Provider:   JAYNE MINDER H [[id number]]     2. Vitiligo     Plan:     Follow up in 4 weeks for recheck

## 2024-07-31 LAB — COMPREHENSIVE METABOLIC PANEL WITH GFR
ALT: 14 IU/L (ref 0–32)
AST: 15 IU/L (ref 0–40)
Albumin: 4.5 g/dL (ref 3.9–4.9)
Alkaline Phosphatase: 57 IU/L (ref 44–121)
BUN/Creatinine Ratio: 12 (ref 9–23)
BUN: 9 mg/dL (ref 6–20)
Bilirubin Total: 0.4 mg/dL (ref 0.0–1.2)
CO2: 22 mmol/L (ref 20–29)
Calcium: 9.9 mg/dL (ref 8.7–10.2)
Chloride: 102 mmol/L (ref 96–106)
Creatinine, Ser: 0.75 mg/dL (ref 0.57–1.00)
Globulin, Total: 2.7 g/dL (ref 1.5–4.5)
Glucose: 91 mg/dL (ref 70–99)
Potassium: 4.1 mmol/L (ref 3.5–5.2)
Sodium: 139 mmol/L (ref 134–144)
Total Protein: 7.2 g/dL (ref 6.0–8.5)
eGFR: 104 mL/min/1.73 (ref >59)

## 2024-07-31 LAB — LIPID PANEL
Chol/HDL Ratio: 3.1 ratio (ref 0.0–4.4)
Cholesterol, Total: 166 mg/dL (ref 100–199)
HDL: 53 mg/dL (ref >39)
LDL Chol Calc (NIH): 96 mg/dL (ref 0–99)
Triglycerides: 94 mg/dL (ref 0–149)
VLDL Cholesterol Cal: 17 mg/dL (ref 5–40)

## 2024-07-31 LAB — CBC
Hematocrit: 36 % (ref 34.0–46.6)
Hemoglobin: 10.5 g/dL — ABNORMAL LOW (ref 11.1–15.9)
MCH: 22.9 pg — ABNORMAL LOW (ref 26.6–33.0)
MCHC: 29.2 g/dL — ABNORMAL LOW (ref 31.5–35.7)
MCV: 79 fL (ref 79–97)
Platelets: 299 x10E3/uL (ref 150–450)
RBC: 4.58 x10E6/uL (ref 3.77–5.28)
RDW: 16.4 % — ABNORMAL HIGH (ref 11.7–15.4)
WBC: 7.2 x10E3/uL (ref 3.4–10.8)

## 2024-08-06 ENCOUNTER — Other Ambulatory Visit: Payer: Self-pay | Admitting: Adult Health

## 2024-08-06 DIAGNOSIS — D649 Anemia, unspecified: Secondary | ICD-10-CM

## 2024-08-06 NOTE — Progress Notes (Signed)
 Check CBC and iron panel in 8 weeks

## 2024-11-16 DIAGNOSIS — M791 Myalgia, unspecified site: Secondary | ICD-10-CM | POA: Diagnosis not present

## 2024-11-16 DIAGNOSIS — J069 Acute upper respiratory infection, unspecified: Secondary | ICD-10-CM | POA: Diagnosis not present
